# Patient Record
Sex: Male | Born: 1935 | Race: Black or African American | Hispanic: No | Marital: Married | Smoking: Never smoker
Health system: Southern US, Community
[De-identification: ages and names within clinical notes are randomized; demographics above are authoritative.]

## PROBLEM LIST (undated history)

## (undated) DIAGNOSIS — E119 Type 2 diabetes mellitus without complications: Secondary | ICD-10-CM

## (undated) DIAGNOSIS — I10 Essential (primary) hypertension: Secondary | ICD-10-CM

---

## 2017-10-11 ENCOUNTER — Other Ambulatory Visit: Payer: Self-pay | Admitting: Nephrology

## 2017-10-11 DIAGNOSIS — I129 Hypertensive chronic kidney disease with stage 1 through stage 4 chronic kidney disease, or unspecified chronic kidney disease: Secondary | ICD-10-CM

## 2017-10-11 DIAGNOSIS — N183 Chronic kidney disease, stage 3 unspecified: Secondary | ICD-10-CM

## 2017-10-20 ENCOUNTER — Ambulatory Visit
Admission: RE | Admit: 2017-10-20 | Discharge: 2017-10-20 | Disposition: A | Discharge status: Home / Self Care | Payer: Medicare Other | Source: Ambulatory Visit | Attending: Nephrology | Admitting: Nephrology

## 2017-10-20 DIAGNOSIS — N183 Chronic kidney disease, stage 3 unspecified: Secondary | ICD-10-CM

## 2017-10-20 DIAGNOSIS — I129 Hypertensive chronic kidney disease with stage 1 through stage 4 chronic kidney disease, or unspecified chronic kidney disease: Secondary | ICD-10-CM

## 2017-10-27 ENCOUNTER — Other Ambulatory Visit: Payer: Self-pay | Admitting: Nephrology

## 2017-10-27 DIAGNOSIS — N281 Cyst of kidney, acquired: Secondary | ICD-10-CM

## 2017-10-27 DIAGNOSIS — T1590XA Foreign body on external eye, part unspecified, unspecified eye, initial encounter: Secondary | ICD-10-CM

## 2017-11-09 ENCOUNTER — Ambulatory Visit
Admission: RE | Admit: 2017-11-09 | Discharge: 2017-11-09 | Disposition: A | Discharge status: Home / Self Care | Payer: Medicare Other | Source: Ambulatory Visit | Attending: Nephrology | Admitting: Nephrology

## 2017-11-09 DIAGNOSIS — T1590XA Foreign body on external eye, part unspecified, unspecified eye, initial encounter: Secondary | ICD-10-CM

## 2017-11-09 DIAGNOSIS — N281 Cyst of kidney, acquired: Secondary | ICD-10-CM

## 2019-04-04 IMAGING — US US RENAL
1 series · 14 of 25 positions shown · non-contrast
Comparison: None.

CLINICAL DATA: Chronic renal disease.

EXAM:
RENAL / URINARY TRACT ULTRASOUND COMPLETE

[Series 1: us renal · 0.28mm/px · 14 of 50 slices shown]
[im 1/50]
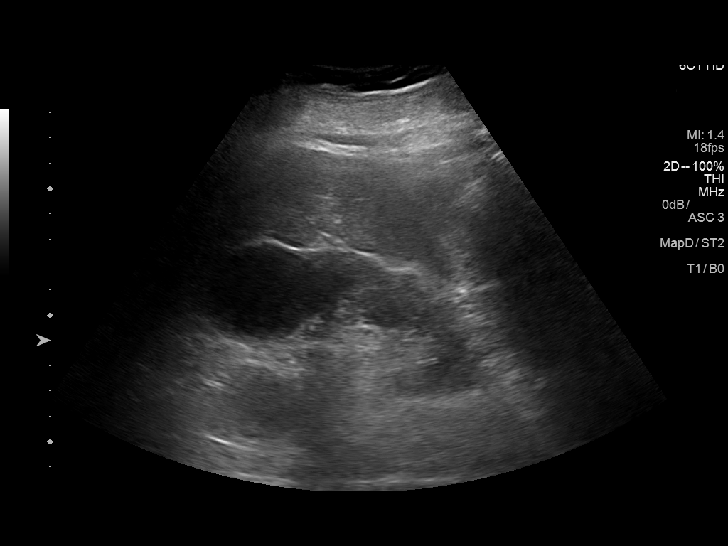
[im 5/50]
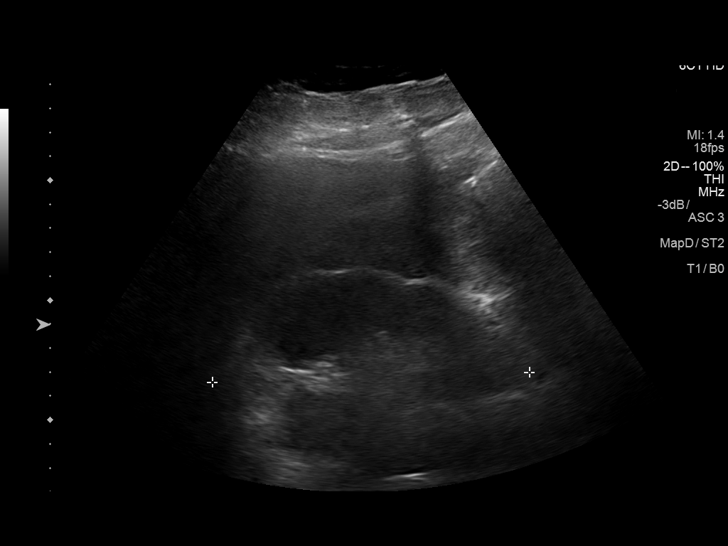
[im 9/50]
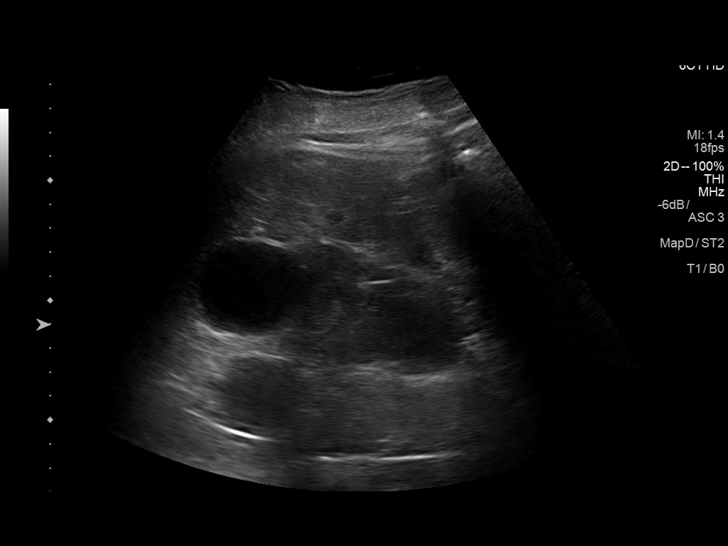
[im 13/50]
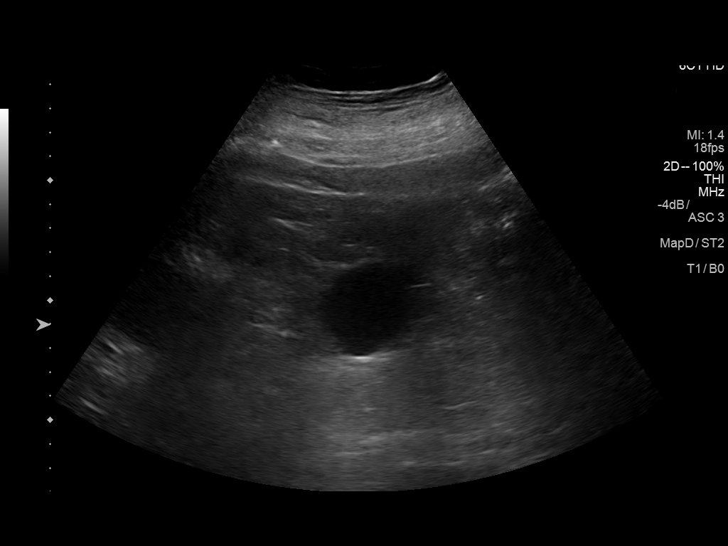
[im 17/50]
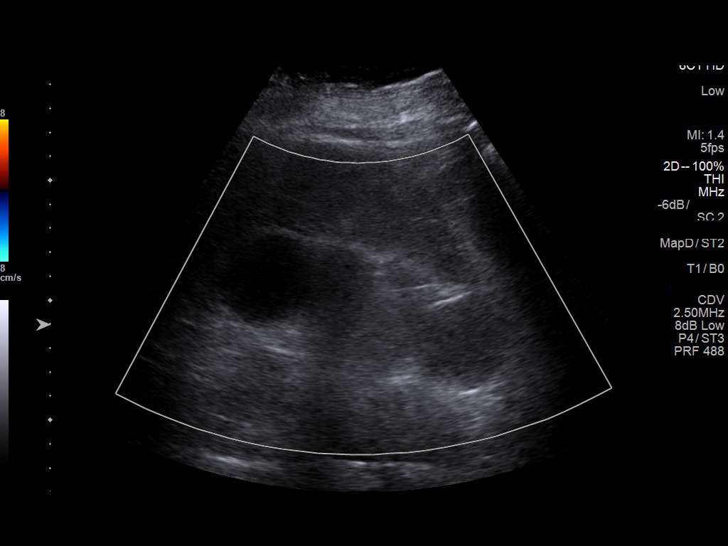
[im 19/50]
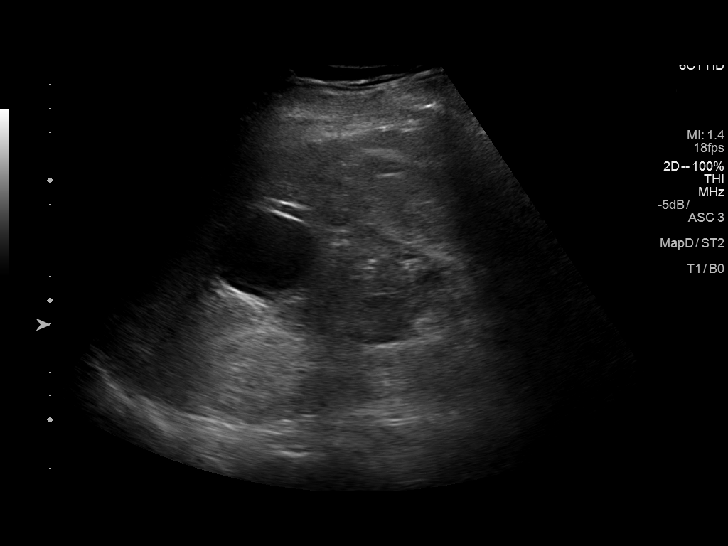
[im 23/50]
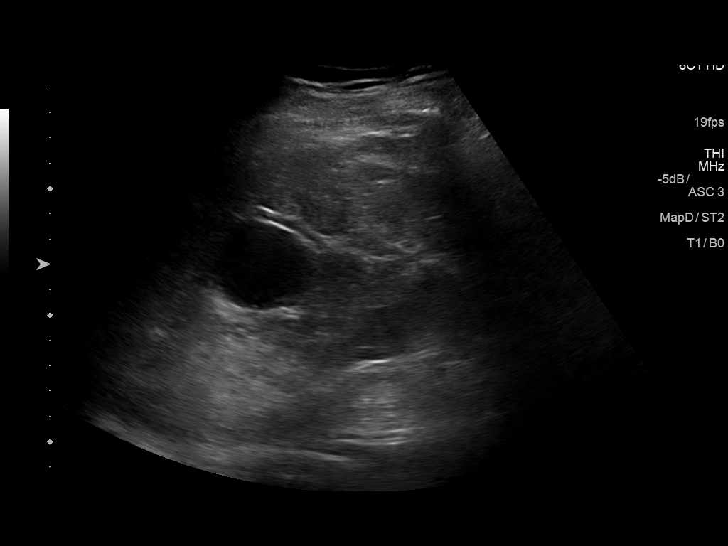
[im 27/50]
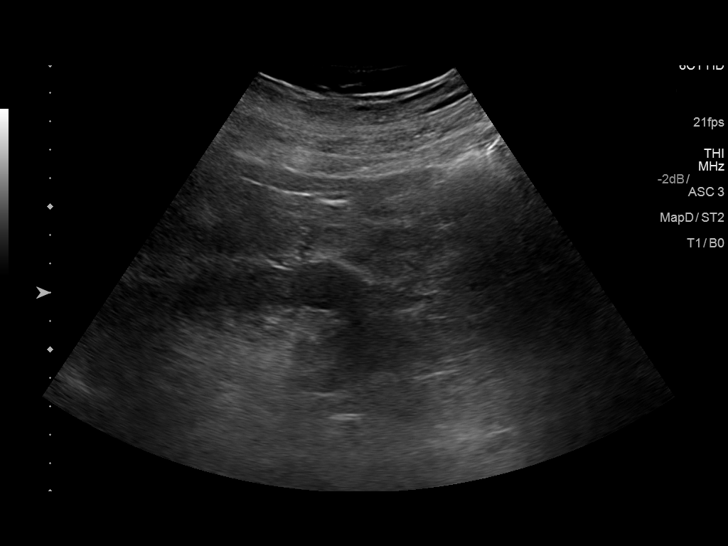
[im 31/50]
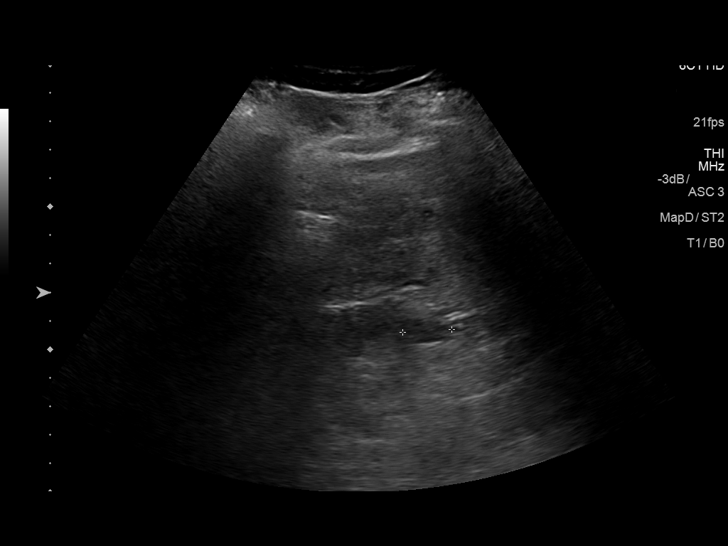
[im 33/50]
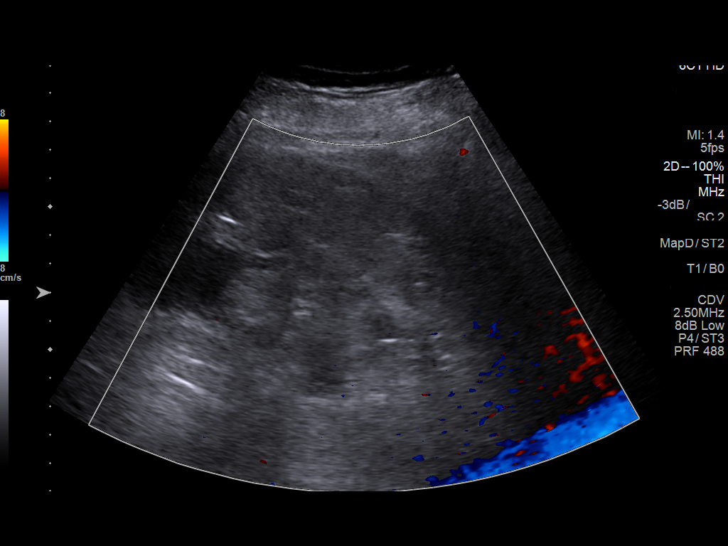
[im 37/50]
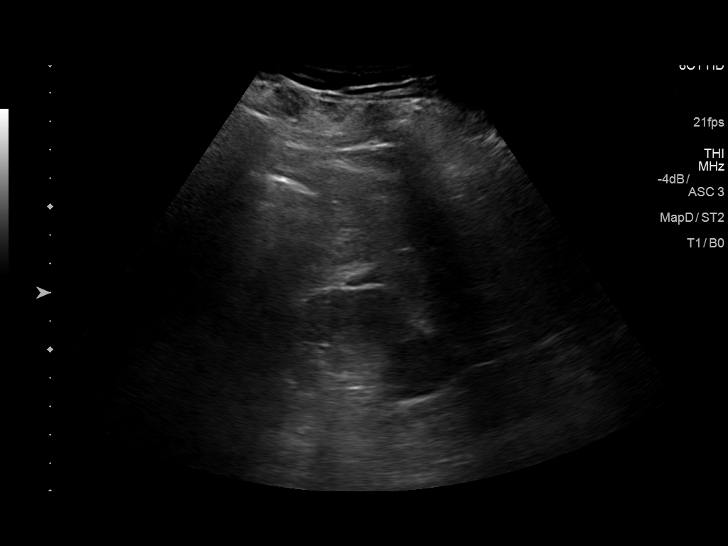
[im 41/50]
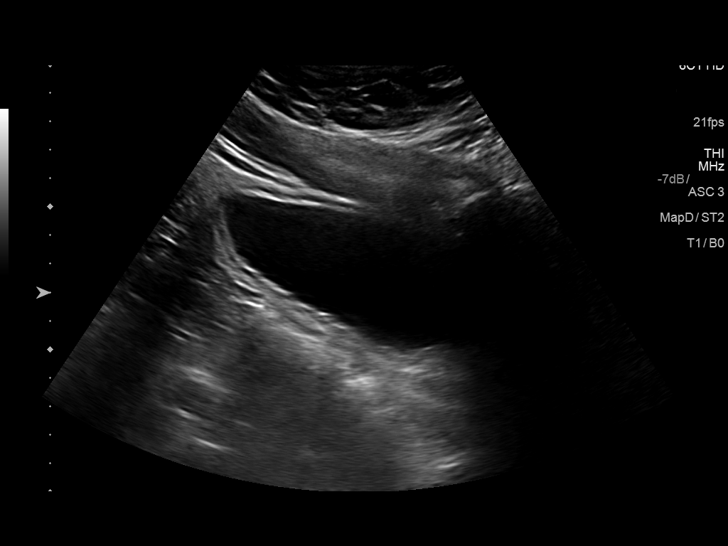
[im 45/50]
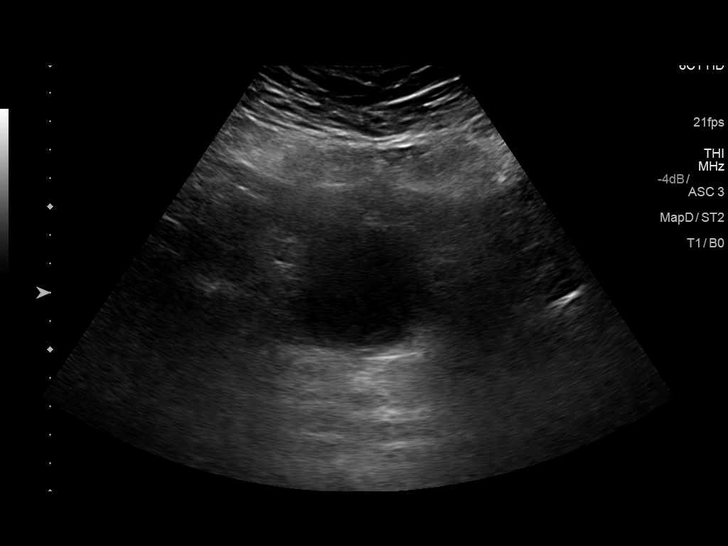
[im 50/50]
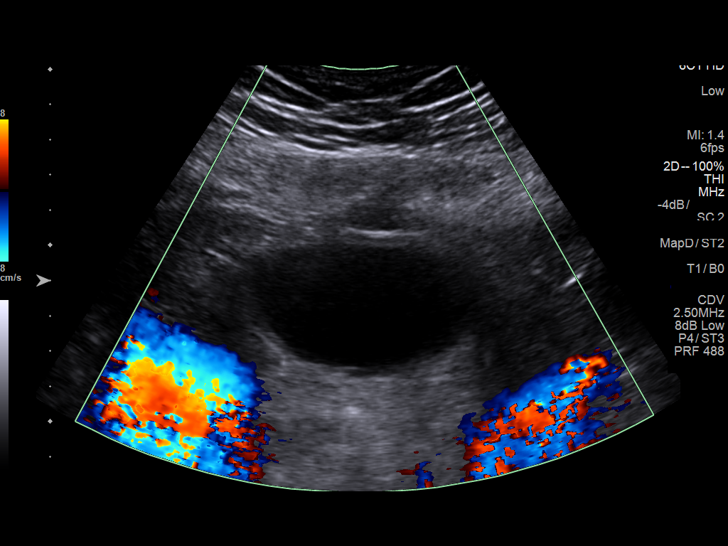

[14 of 25 positions shown; findings below may reference images not displayed]

FINDINGS: Right Kidney:

Length: 13.2 cm. Three masses are seen in the right kidney. The
first in the upper pole measures 5 cm and is a cyst. The second the
posterior aspect of the upper pole measures up to 3.8 cm and is
hypoechoic, probably a complex cyst. The third in the lower pole
measures 5 cm and is also likely a complex cyst. No hydronephrosis.

Left Kidney:

Length: 11.1 cm. Three masses are seen in the left kidney. The first
in the upper pole is a simple cyst measuring 4.1 cm. The second in
the mid to lower left kidney measures 1.5 cm and is hypoechoic with
no internal blood flow. The third in the posterior mid kidney
measures 2.7 cm and is also hypoechoic with no identified internal
blood flow. No hydronephrosis.

Bladder:

Appears normal for degree of bladder distention.
IMPRESSION: 1. Renal cysts.
2. There are several hypoechoic masses in both kidneys which are
nonspecific with ultrasound but most likely complex cysts. An MRI
could confirm as clinically warranted.

## 2021-07-19 ENCOUNTER — Emergency Department (HOSPITAL_COMMUNITY): Payer: No Typology Code available for payment source

## 2021-07-19 ENCOUNTER — Other Ambulatory Visit: Payer: Self-pay

## 2021-07-19 ENCOUNTER — Encounter (HOSPITAL_COMMUNITY): Payer: Self-pay

## 2021-07-19 ENCOUNTER — Inpatient Hospital Stay (HOSPITAL_COMMUNITY)
Admission: EM | Admit: 2021-07-19 | Discharge: 2021-07-23 | DRG: 641 | Disposition: A | Discharge status: Skilled Nursing Facility | Payer: No Typology Code available for payment source | Attending: Internal Medicine | Admitting: Internal Medicine

## 2021-07-19 ENCOUNTER — Observation Stay (HOSPITAL_COMMUNITY): Payer: No Typology Code available for payment source

## 2021-07-19 DIAGNOSIS — N179 Acute kidney failure, unspecified: Secondary | ICD-10-CM | POA: Diagnosis present

## 2021-07-19 DIAGNOSIS — Z794 Long term (current) use of insulin: Secondary | ICD-10-CM

## 2021-07-19 DIAGNOSIS — W19XXXA Unspecified fall, initial encounter: Secondary | ICD-10-CM | POA: Diagnosis present

## 2021-07-19 DIAGNOSIS — Z6830 Body mass index (BMI) 30.0-30.9, adult: Secondary | ICD-10-CM

## 2021-07-19 DIAGNOSIS — M25461 Effusion, right knee: Secondary | ICD-10-CM | POA: Diagnosis present

## 2021-07-19 DIAGNOSIS — Z23 Encounter for immunization: Secondary | ICD-10-CM

## 2021-07-19 DIAGNOSIS — E669 Obesity, unspecified: Secondary | ICD-10-CM | POA: Diagnosis present

## 2021-07-19 DIAGNOSIS — B029 Zoster without complications: Secondary | ICD-10-CM | POA: Diagnosis present

## 2021-07-19 DIAGNOSIS — M6282 Rhabdomyolysis: Secondary | ICD-10-CM

## 2021-07-19 DIAGNOSIS — D72829 Elevated white blood cell count, unspecified: Secondary | ICD-10-CM | POA: Diagnosis present

## 2021-07-19 DIAGNOSIS — I10 Essential (primary) hypertension: Secondary | ICD-10-CM | POA: Diagnosis present

## 2021-07-19 DIAGNOSIS — Z888 Allergy status to other drugs, medicaments and biological substances status: Secondary | ICD-10-CM

## 2021-07-19 DIAGNOSIS — I129 Hypertensive chronic kidney disease with stage 1 through stage 4 chronic kidney disease, or unspecified chronic kidney disease: Secondary | ICD-10-CM | POA: Diagnosis present

## 2021-07-19 DIAGNOSIS — Z79899 Other long term (current) drug therapy: Secondary | ICD-10-CM

## 2021-07-19 DIAGNOSIS — Z880 Allergy status to penicillin: Secondary | ICD-10-CM

## 2021-07-19 DIAGNOSIS — Z602 Problems related to living alone: Secondary | ICD-10-CM | POA: Diagnosis present

## 2021-07-19 DIAGNOSIS — E860 Dehydration: Secondary | ICD-10-CM | POA: Diagnosis not present

## 2021-07-19 DIAGNOSIS — E1122 Type 2 diabetes mellitus with diabetic chronic kidney disease: Secondary | ICD-10-CM | POA: Diagnosis present

## 2021-07-19 DIAGNOSIS — Y92009 Unspecified place in unspecified non-institutional (private) residence as the place of occurrence of the external cause: Secondary | ICD-10-CM

## 2021-07-19 DIAGNOSIS — Z7984 Long term (current) use of oral hypoglycemic drugs: Secondary | ICD-10-CM

## 2021-07-19 DIAGNOSIS — E119 Type 2 diabetes mellitus without complications: Secondary | ICD-10-CM

## 2021-07-19 DIAGNOSIS — R5381 Other malaise: Secondary | ICD-10-CM | POA: Diagnosis present

## 2021-07-19 DIAGNOSIS — E872 Acidosis, unspecified: Secondary | ICD-10-CM | POA: Diagnosis present

## 2021-07-19 DIAGNOSIS — H401112 Primary open-angle glaucoma, right eye, moderate stage: Secondary | ICD-10-CM | POA: Insufficient documentation

## 2021-07-19 DIAGNOSIS — Z887 Allergy status to serum and vaccine status: Secondary | ICD-10-CM

## 2021-07-19 DIAGNOSIS — N1832 Chronic kidney disease, stage 3b: Secondary | ICD-10-CM | POA: Diagnosis present

## 2021-07-19 DIAGNOSIS — Z7982 Long term (current) use of aspirin: Secondary | ICD-10-CM

## 2021-07-19 DIAGNOSIS — Z20822 Contact with and (suspected) exposure to covid-19: Secondary | ICD-10-CM | POA: Diagnosis present

## 2021-07-19 HISTORY — DX: Type 2 diabetes mellitus without complications: E11.9

## 2021-07-19 HISTORY — DX: Essential (primary) hypertension: I10

## 2021-07-19 LAB — CBC WITH DIFFERENTIAL/PLATELET
Abs Immature Granulocytes: 0.09 10*3/uL — ABNORMAL HIGH (ref 0.00–0.07)
Basophils Absolute: 0 10*3/uL (ref 0.0–0.1)
Basophils Relative: 0 %
Eosinophils Absolute: 0 10*3/uL (ref 0.0–0.5)
Eosinophils Relative: 0 %
HCT: 42.1 % (ref 39.0–52.0)
Hemoglobin: 14.2 g/dL (ref 13.0–17.0)
Immature Granulocytes: 1 %
Lymphocytes Relative: 5 %
Lymphs Abs: 0.8 10*3/uL (ref 0.7–4.0)
MCH: 29.5 pg (ref 26.0–34.0)
MCHC: 33.7 g/dL (ref 30.0–36.0)
MCV: 87.5 fL (ref 80.0–100.0)
Monocytes Absolute: 1.3 10*3/uL — ABNORMAL HIGH (ref 0.1–1.0)
Monocytes Relative: 7 %
Neutro Abs: 15.3 10*3/uL — ABNORMAL HIGH (ref 1.7–7.7)
Neutrophils Relative %: 87 %
Platelets: 193 10*3/uL (ref 150–400)
RBC: 4.81 MIL/uL (ref 4.22–5.81)
RDW: 13.7 % (ref 11.5–15.5)
WBC: 17.5 10*3/uL — ABNORMAL HIGH (ref 4.0–10.5)
nRBC: 0 % (ref 0.0–0.2)

## 2021-07-19 LAB — LACTIC ACID, PLASMA
Lactic Acid, Venous: 2.1 mmol/L (ref 0.5–1.9)
Lactic Acid, Venous: 1.5 mmol/L (ref 0.5–1.9)

## 2021-07-19 LAB — COMPREHENSIVE METABOLIC PANEL
ALT: 29 U/L (ref 0–44)
AST: 71 U/L — ABNORMAL HIGH (ref 15–41)
Albumin: 3.7 g/dL (ref 3.5–5.0)
Alkaline Phosphatase: 54 U/L (ref 38–126)
Anion gap: 12 (ref 5–15)
BUN: 47 mg/dL — ABNORMAL HIGH (ref 8–23)
CO2: 18 mmol/L — ABNORMAL LOW (ref 22–32)
Calcium: 9.2 mg/dL (ref 8.9–10.3)
Chloride: 112 mmol/L — ABNORMAL HIGH (ref 98–111)
Creatinine, Ser: 2.01 mg/dL — ABNORMAL HIGH (ref 0.61–1.24)
GFR, Estimated: 32 mL/min — ABNORMAL LOW (ref >60)
Glucose, Bld: 133 mg/dL — ABNORMAL HIGH (ref 70–99)
Potassium: 4.7 mmol/L (ref 3.5–5.1)
Sodium: 142 mmol/L (ref 135–145)
Total Bilirubin: 1.1 mg/dL (ref 0.3–1.2)
Total Protein: 7.6 g/dL (ref 6.5–8.1)

## 2021-07-19 LAB — URINALYSIS, ROUTINE W REFLEX MICROSCOPIC
Bacteria, UA: NONE SEEN
Bilirubin Urine: NEGATIVE
Glucose, UA: NEGATIVE mg/dL
Ketones, ur: 20 mg/dL — AB
Leukocytes,Ua: NEGATIVE
Nitrite: NEGATIVE
Protein, ur: 100 mg/dL — AB
Specific Gravity, Urine: 1.018 (ref 1.005–1.030)
pH: 5 (ref 5.0–8.0)

## 2021-07-19 LAB — GLUCOSE, CAPILLARY
Glucose-Capillary: 143 mg/dL — ABNORMAL HIGH (ref 70–99)
Glucose-Capillary: 163 mg/dL — ABNORMAL HIGH (ref 70–99)

## 2021-07-19 LAB — BASIC METABOLIC PANEL
Anion gap: 13 (ref 5–15)
BUN: 45 mg/dL — ABNORMAL HIGH (ref 8–23)
CO2: 18 mmol/L — ABNORMAL LOW (ref 22–32)
Calcium: 8.8 mg/dL — ABNORMAL LOW (ref 8.9–10.3)
Chloride: 112 mmol/L — ABNORMAL HIGH (ref 98–111)
Creatinine, Ser: 1.87 mg/dL — ABNORMAL HIGH (ref 0.61–1.24)
GFR, Estimated: 34 mL/min — ABNORMAL LOW (ref >60)
Glucose, Bld: 170 mg/dL — ABNORMAL HIGH (ref 70–99)
Potassium: 4 mmol/L (ref 3.5–5.1)
Sodium: 143 mmol/L (ref 135–145)

## 2021-07-19 LAB — HEMOGLOBIN A1C
Hgb A1c MFr Bld: 7.1 % — ABNORMAL HIGH (ref 4.8–5.6)
Mean Plasma Glucose: 157.07 mg/dL

## 2021-07-19 LAB — ETHANOL: Alcohol, Ethyl (B): <10 mg/dL (ref <10)

## 2021-07-19 LAB — CK: Total CK: 2076 U/L — ABNORMAL HIGH (ref 49–397)

## 2021-07-19 LAB — CBG MONITORING, ED: Glucose-Capillary: 116 mg/dL — ABNORMAL HIGH (ref 70–99)

## 2021-07-19 LAB — SARS CORONAVIRUS 2 BY RT PCR: SARS Coronavirus 2 by RT PCR: NEGATIVE

## 2021-07-19 MED ORDER — ATENOLOL 50 MG PO TABS
100.0000 mg | ORAL_TABLET | Freq: Every day | ORAL | Status: DC
  Administered 2021-07-19 – 2021-07-23 (×5): 100 mg via ORAL
  Filled 2021-07-19 (×5): qty 2

## 2021-07-19 MED ORDER — ENOXAPARIN SODIUM 40 MG/0.4ML IJ SOSY
40.0000 mg | PREFILLED_SYRINGE | INTRAMUSCULAR | Status: DC
  Administered 2021-07-19 – 2021-07-22 (×4): 40 mg via SUBCUTANEOUS
  Filled 2021-07-19 (×4): qty 0.4

## 2021-07-19 MED ORDER — INSULIN ASPART 100 UNIT/ML IJ SOLN
0.0000 [IU] | Freq: Three times a day (TID) | INTRAMUSCULAR | Status: DC
  Administered 2021-07-20: 5 [IU] via SUBCUTANEOUS
  Administered 2021-07-20: 3 [IU] via SUBCUTANEOUS
  Administered 2021-07-20: 5 [IU] via SUBCUTANEOUS
  Administered 2021-07-21: 2 [IU] via SUBCUTANEOUS
  Administered 2021-07-21: 5 [IU] via SUBCUTANEOUS
  Administered 2021-07-21: 3 [IU] via SUBCUTANEOUS
  Administered 2021-07-22: 5 [IU] via SUBCUTANEOUS
  Administered 2021-07-22 – 2021-07-23 (×2): 3 [IU] via SUBCUTANEOUS

## 2021-07-19 MED ORDER — LACTATED RINGERS IV BOLUS
1000.0000 mL | Freq: Once | INTRAVENOUS | Status: AC
  Administered 2021-07-19: 1000 mL via INTRAVENOUS

## 2021-07-19 MED ORDER — HYDRALAZINE HCL 20 MG/ML IJ SOLN
10.0000 mg | INTRAMUSCULAR | Status: DC | PRN
  Administered 2021-07-19 – 2021-07-23 (×4): 10 mg via INTRAVENOUS
  Filled 2021-07-19 (×4): qty 1

## 2021-07-19 MED ORDER — INSULIN GLARGINE-YFGN 100 UNIT/ML ~~LOC~~ SOLN
45.0000 [IU] | Freq: Every day | SUBCUTANEOUS | Status: DC
Start: 2021-07-19 — End: 2021-07-23
  Administered 2021-07-19 – 2021-07-22 (×4): 45 [IU] via SUBCUTANEOUS
  Filled 2021-07-19 (×4): qty 0.45

## 2021-07-19 MED ORDER — FAMOTIDINE 20 MG PO TABS
20.0000 mg | ORAL_TABLET | Freq: Every day | ORAL | Status: DC
  Administered 2021-07-19 – 2021-07-22 (×4): 20 mg via ORAL
  Filled 2021-07-19 (×4): qty 1

## 2021-07-19 MED ORDER — LACTATED RINGERS IV SOLN: INTRAVENOUS | Status: DC

## 2021-07-19 MED ORDER — LATANOPROST 0.005 % OP SOLN
1.0000 [drp] | Freq: Every day | OPHTHALMIC | Status: DC
  Administered 2021-07-19 – 2021-07-22 (×4): 1 [drp] via OPHTHALMIC
  Filled 2021-07-19: qty 2.5

## 2021-07-19 MED ORDER — ENSURE ENLIVE PO LIQD
237.0000 mL | Freq: Two times a day (BID) | ORAL | Status: DC
Start: 2021-07-20 — End: 2021-07-23
  Administered 2021-07-20 – 2021-07-23 (×6): 237 mL via ORAL

## 2021-07-19 MED ORDER — TIMOLOL MALEATE 0.5 % OP SOLG
1.0000 [drp] | Freq: Every morning | OPHTHALMIC | Status: DC
  Administered 2021-07-20 – 2021-07-23 (×4): 1 [drp] via OPHTHALMIC
  Filled 2021-07-19: qty 5

## 2021-07-19 NOTE — ED Provider Notes (Signed)
[facility]EM[facility]GENCY DEPT Provider Note   CSN: [id number] Arrival date & time: 07/19/21  1157     History  Chief Complaint  Patient presents with   Fall   Weakness   Altered Mental Status    Tavaris Eudy is a 88 y.o. male.  88 year old male who presents from home due to weakness and falls.  Patient lives by himself and states that his legs gave out on him and he fell down.  Did not strike his head.  Denies any recent illnesses.  No new medication changes.  Has had some dysuria as well as dark urine.  No chest pain or shortness of breath.  EMS was called after his family had trouble getting a hold of him.  Blood sugar was 140.  Patient transported here for further evaluation       Home Medications Prior to Admission medications   Not on File      Allergies    Metformin and related and Penicillins    Review of Systems   Review of Systems  All other systems reviewed and are negative.   Physical Exam Updated Vital Signs BP (!) 172/77 (BP Location: Right Arm)   Pulse 97   Temp 98.6 F (37 C) (Oral)   Resp (!) 29   Ht 1.803 m (5\' 11" )   Wt 99.8 kg   SpO2 98%   BMI 30.68 kg/m  Physical Exam Vitals and nursing note reviewed.  Constitutional:      General: He is not in acute distress.    Appearance: Normal appearance. He is well-developed. He is not toxic-appearing.  HENT:     Head: Normocephalic and atraumatic.  Eyes:     General: Lids are normal.     Conjunctiva/sclera: Conjunctivae normal.     Pupils: Pupils are equal, round, and reactive to light.  Neck:     Thyroid: No thyroid mass.     Trachea: No tracheal deviation.  Cardiovascular:     Rate and Rhythm: Normal rate and regular rhythm.     Heart sounds: Normal heart sounds. No murmur heard.    No gallop.  Pulmonary:     Effort: Pulmonary effort is normal. No respiratory distress.     Breath sounds: Normal breath sounds. No stridor. No decreased breath sounds, wheezing, rhonchi or  rales.  Abdominal:     General: There is no distension.     Palpations: Abdomen is soft.     Tenderness: There is no abdominal tenderness. There is no rebound.  Musculoskeletal:        General: No tenderness. Normal range of motion.     Cervical back: Normal range of motion and neck supple.  Skin:    General: Skin is warm and dry.     Findings: No abrasion or rash.  Neurological:     General: No focal deficit present.     Mental Status: He is alert and oriented to person, place, and time. Mental status is at baseline.     GCS: GCS eye subscore is 4. GCS verbal subscore is 5. GCS motor subscore is 6.     Cranial Nerves: No cranial nerve deficit.     Sensory: No sensory deficit.     Motor: Motor function is intact.  Psychiatric:        Attention and Perception: Attention normal.        Speech: Speech normal.        Behavior: Behavior normal.    ED  Results / Procedures / Treatments   Labs (all labs ordered are listed, but only abnormal results are displayed) Labs Reviewed  CBG MONITORING, ED - Abnormal; Notable for the following components:      Result Value   Glucose-Capillary 116 (*)    All other components within normal limits  SARS CORONAVIRUS 2 BY RT PCR  CULTURE, BLOOD (ROUTINE X 2)  CULTURE, BLOOD (ROUTINE X 2)  URINALYSIS, ROUTINE W REFLEX MICROSCOPIC  CBC WITH DIFFERENTIAL/PLATELET  COMPREHENSIVE METABOLIC PANEL  ETHANOL  LACTIC ACID, PLASMA  LACTIC ACID, PLASMA    EKG EKG Interpretation  Date/Time:  Saturday July 19 2021 12:51:45 EDT Ventricular Rate:  96 PR Interval:  186 QRS Duration: 82 QT Interval:  348 QTC Calculation: 440 R Axis:   84 Text Interpretation: Sinus rhythm Borderline right axis deviation Confirmed by Lorre Nick ([id number]) on 07/19/2021 1:49:41 PM  Radiology No results found.  Procedures Procedures    Medications Ordered in ED Medications  lactated ringers bolus 1,000 mL (has no administration in time range)  lactated ringers  infusion (has no administration in time range)    ED Course/ Medical Decision Making/ A&P                           Medical Decision Making Amount and/or Complexity of Data Reviewed Labs: ordered. Radiology: ordered. ECG/medicine tests: ordered.  Risk Prescription drug management. Decision regarding hospitalization.  Patient is EKG per my interpretation shows normal sinus rhythm.  No signs of acute ischemic changes. Patient has evidence of dehydration based on his electrolytes.  Was given IV fluids here.  My leukocytosis on the CBC.  Chest x-ray per my interpretation showed no acute findings.  Patient has a mild elevation of his CK at over 2000.  Possibly some element of rhabdomyolysis.  This will be treated with IV fluids.  He is COVID-negative here.  Does have a mildly elevated lactate 2.1.  Will repeat.  He is afebrile.  We will hold off on doing full sepsis protocol at this time.  Discussed with hospitalist and patient will be admitted  CRITICAL CARE Performed by: Toy Baker Total critical care time: 50 minutes Critical care time was exclusive of separately billable procedures and treating other patients. Critical care was necessary to treat or prevent imminent or life-threatening deterioration. Critical care was time spent personally by me on the following activities: development of treatment plan with patient and/or surrogate as well as nursing, discussions with consultants, evaluation of patient's response to treatment, examination of patient, obtaining history from patient or surrogate, ordering and performing treatments and interventions, ordering and review of laboratory studies, ordering and review of radiographic studies, pulse oximetry and re-evaluation of patient's condition.         Final Clinical Impression(s) / ED Diagnoses Final diagnoses:  None    Rx / DC Orders ED Discharge Orders     None         Lorre Nick, MD 07/19/21 1454

## 2021-07-19 NOTE — ED Notes (Signed)
ED TO INPATIENT HANDOFF REPORT  Name/Age/Gender Antonio White 88 y.o. male  Code Status   Home/SNF/Other Home  Chief Complaint AKI (acute kidney injury) (HCC) [N17.9]  Level of Care/Admitting Diagnosis ED Disposition     ED Disposition  Admit   Condition  --   Comment  Hospital Area: [facility] [[id number]]  Level of Care: Telemetry [5]  Admit to tele based on following criteria: Other see comments  Comments: fall  [facility] or [facility] if equivalent level of care is available:: Yes  Covid Evaluation: Asymptomatic - no recent exposure (last 10 days) testing not required  Diagnosis: AKI (acute kidney injury) [facility]) [[id number]]  Admitting Physician: Erenest Blank  Attending Physician: Kathrynn Running [[id number]]          Medical History Past Medical History:  Diagnosis Date   Diabetes mellitus without complication (HCC)    Hypertension     Allergies Allergies  Allergen Reactions   Metformin And Related Hives   Penicillins Hives   Pneumococcal Vaccine Swelling and Other (See Comments)    Swelling of upper limb - reported by [facility]    IV Location/Drains/Wounds Patient Lines/Drains/Airways Status     Active Line/Drains/Airways     Name Placement date Placement time Site Days   Peripheral IV 07/19/21 20 G 1" Right;Posterior Hand 07/19/21  --  Hand  less than 1   Peripheral IV 07/19/21 20 G 1" Left Antecubital 07/19/21  1312  Antecubital  less than 1            Labs/Imaging Results for orders placed or performed during the hospital encounter of 07/19/21 (from the past 48 hour(s))  CBG monitoring, ED     Status: Abnormal   Collection Time: 07/19/21 12:24 PM  Result Value Ref Range   Glucose-Capillary 116 (H) 70 - 99 mg/dL    Comment: Glucose reference range applies only to samples taken after fasting for at least 8 hours.  SARS Coronavirus 2 by RT PCR (hospital order, performed in [facility] hospital lab) *cepheid single result test* Anterior Nasal Swab     Status: None   Collection Time: 07/19/21 12:34 PM   Specimen: Anterior Nasal Swab  Result Value Ref Range   SARS Coronavirus 2 by RT PCR NEGATIVE NEGATIVE    Comment: (NOTE) SARS-CoV-2 target nucleic acids are NOT DETECTED.  The SARS-CoV-2 RNA is generally detectable in upper and lower respiratory specimens during the acute phase of infection. The lowest concentration of SARS-CoV-2 viral copies this assay can detect is 250 copies / mL. A negative result does not preclude SARS-CoV-2 infection and should not be used as the sole basis for treatment or other patient management decisions.  A negative result may occur with improper specimen collection / handling, submission of specimen other than nasopharyngeal swab, presence of viral mutation(s) within the areas targeted by this assay, and inadequate number of viral copies (<250 copies / mL). A negative result must be combined with clinical observations, patient history, and epidemiological information.  Fact Sheet for Patients:   RoadLapTop.co.za  Fact Sheet for Healthcare Providers: http://kim-miller.com/  This test is not yet approved or  cleared by the [place] FDA and has been authorized for detection and/or diagnosis of SARS-CoV-2 by FDA under an Emergency Use Authorization (EUA).  This EUA will remain in effect (meaning this test can be used) for the duration of the COVID-19 declaration under Section 564(b)(1) of the Act,  21 U.S.C. section 360bbb-3(b)(1), unless the authorization is terminated or revoked sooner.  Performed at [facility], [address], [place], [place] [address]   Ethanol     Status: None   Collection Time: 07/19/21  1:17 PM  Result Value Ref Range   Alcohol, Ethyl (B) <10 <10 mg/dL    Comment: (NOTE) Lowest detectable limit for serum alcohol is 10 mg/dL.  For  medical purposes only. Performed at [facility], [address] [place], [place] [address]   Lactic acid, plasma     Status: Abnormal   Collection Time: 07/19/21  1:17 PM  Result Value Ref Range   Lactic Acid, Venous 2.1 (HH) 0.5 - 1.9 mmol/L    Comment: CRITICAL RESULT CALLED TO, READ BACK BY AND VERIFIED WITH SMITH,A AT 1427 ON 07/19/21 BY LUZOLOP Performed at [facility], [address] [place], [place] [address]   CBC with Differential/Platelet     Status: Abnormal   Collection Time: 07/19/21  1:44 PM  Result Value Ref Range   WBC 17.5 (H) 4.0 - 10.5 K/uL   RBC 4.81 4.22 - 5.81 MIL/uL   Hemoglobin 14.2 13.0 - 17.0 g/dL   HCT 25.4 27.0 - 62.3 %   MCV 87.5 80.0 - 100.0 fL   MCH 29.5 26.0 - 34.0 pg   MCHC 33.7 30.0 - 36.0 g/dL   RDW 76.2 83.1 - 51.7 %   Platelets 193 150 - 400 K/uL   nRBC 0.0 0.0 - 0.2 %   Neutrophils Relative % 87 %   Neutro Abs 15.3 (H) 1.7 - 7.7 K/uL   Lymphocytes Relative 5 %   Lymphs Abs 0.8 0.7 - 4.0 K/uL   Monocytes Relative 7 %   Monocytes Absolute 1.3 (H) 0.1 - 1.0 K/uL   Eosinophils Relative 0 %   Eosinophils Absolute 0.0 0.0 - 0.5 K/uL   Basophils Relative 0 %   Basophils Absolute 0.0 0.0 - 0.1 K/uL   Immature Granulocytes 1 %   Abs Immature Granulocytes 0.09 (H) 0.00 - 0.07 K/uL    Comment: Performed at [facility], [address] [place], [place] [address]  Comprehensive metabolic panel     Status: Abnormal   Collection Time: 07/19/21  1:44 PM  Result Value Ref Range   Sodium 142 135 - 145 mmol/L   Potassium 4.7 3.5 - 5.1 mmol/L   Chloride 112 (H) 98 - 111 mmol/L   CO2 18 (L) 22 - 32 mmol/L   Glucose, Bld 133 (H) 70 - 99 mg/dL    Comment: Glucose reference range applies only to samples taken after fasting for at least 8 hours.   BUN 47 (H) 8 - 23 mg/dL   Creatinine, Ser 3.71 (H) 0.61 - 1.24 mg/dL   Calcium 9.2 8.9 - 06.2 mg/dL   Total Protein 7.6 6.5 - 8.1 g/dL   Albumin 3.7  3.5 - 5.0 g/dL   AST 71 (H) 15 - 41 U/L   ALT 29 0 - 44 U/L   Alkaline Phosphatase 54 38 - 126 U/L   Total Bilirubin 1.1 0.3 - 1.2 mg/dL   GFR, Estimated 32 (L) >60 mL/min    Comment: (NOTE) Calculated using the CKD-EPI Creatinine Equation (2021)    Anion gap 12 5 - 15    Comment: Performed at [facility], [address] [place], [place] [address]  CK     Status: Abnormal   Collection Time: 07/19/21  1:44 PM  Result Value Ref Range   Total CK 2,076 (H) 49 - 397 U/L    Comment: Performed at [facility], [address], [place], [place] [address]  Urinalysis, Routine w reflex microscopic Urine, In & Out Cath     Status: Abnormal   Collection Time: 07/19/21  2:53 PM  Result Value Ref Range   Color, Urine YELLOW YELLOW   APPearance CLEAR CLEAR   Specific Gravity, Urine 1.018 1.005 - 1.030   pH 5.0 5.0 - 8.0   Glucose, UA NEGATIVE NEGATIVE mg/dL   Hgb urine dipstick LARGE (A) NEGATIVE   Bilirubin Urine NEGATIVE NEGATIVE   Ketones, ur 20 (A) NEGATIVE mg/dL   Protein, ur 762 (A) NEGATIVE mg/dL   Nitrite NEGATIVE NEGATIVE   Leukocytes,Ua NEGATIVE NEGATIVE   RBC / HPF 0-5 0 - 5 RBC/hpf   WBC, UA 0-5 0 - 5 WBC/hpf   Bacteria, UA NONE SEEN NONE SEEN   Mucus PRESENT    Hyaline Casts, UA PRESENT     Comment: Performed at [facility], [address], [place], [place] [address]   DG Chest Port 1 View  Result Date: 07/19/2021 CLINICAL DATA:  Shortness of breath EXAM: PORTABLE CHEST 1 VIEW COMPARISON:  None Available. FINDINGS: The heart size and mediastinal contours are within normal limits. Both lungs are clear. The visualized skeletal structures are unremarkable. IMPRESSION: No active disease. Electronically Signed   By: Duanne Guess D.O.   On: 07/19/2021 14:00    Pending Labs Unresulted Labs (From admission, onward)     Start     Ordered   07/19/21 1235  Culture, blood (Routine X 2) w Reflex to ID Panel  BLOOD CULTURE X  2,   R     Question:  Patient immune status  Answer:  Normal   07/19/21 1234   07/19/21 1235  Lactic acid, plasma  Now then every 2 hours,   R      07/19/21 1234   Signed and Held  Hemoglobin A1c  Add-on,   R       Comments: To assess prior glycemic control    Signed and Held   Signed and Held  CBC  Tomorrow morning,   R        Signed and Held   Signed and Held  Basic metabolic panel  Tomorrow morning,   R        Signed and Held   Signed and Held  CK  Tomorrow morning,   R        Signed and Held   Signed and Held  Basic metabolic panel  Once-Timed,   R        Signed and Held            Vitals/Pain Today's Vitals   07/19/21 1217 07/19/21 1218 07/19/21 1413 07/19/21 1430  BP:  (!) 172/77 (!) 189/86 (!) 183/80  Pulse:  97 93 97  Resp:  (!) 29 (!) 24 (!) 28  Temp:  98.6 F (37 C)    TempSrc:  Oral    SpO2:  98% 100% 98%  Weight: 220 lb (99.8 kg)     Height: 5\' 11"  (1.803 m)     PainSc: 0-No pain       Isolation Precautions Airborne and Contact precautions  Medications Medications  lactated ringers infusion ( Intravenous New Bag/Given 07/19/21 1451)  lactated ringers bolus 1,000 mL (0 mLs Intravenous Stopped 07/19/21 1451)  Mobility walks with person assist

## 2021-07-19 NOTE — ED Triage Notes (Signed)
New onset confusion to time and situation

## 2021-07-19 NOTE — Progress Notes (Signed)
Given report from RN from prior shift. Agree with previous RN assessment. Pt resting comfortably in bed. Denies any further needs at this time.

## 2021-07-19 NOTE — Progress Notes (Signed)
Patient awake, alert but confused. Patient cleaned, assessed and medicated for this evening as per orders. Patient made aware of pending transfer to 4W. Report called to [place], Charity fundraiser. Patient to be transferred in bed to new unit by staff.

## 2021-07-19 NOTE — H&P (Signed)
History and Physical    Antonio White IRC:[id number] DOB: 06-Aug-1932 DOA: 07/19/2021  PCP: Anson Fret, MD  Patient coming from: home by way of ems   Chief Complaint: fall  HPI: Antonio White is a 88 y.o. male with medical history significant for obesity, dm, htn, ckd 3b, cared for by the [facility], presents with the above.  Has been dealing with shingles affecting right upper leg for 3 months. Says his right knee also "gives out" on him sometimes. Earlier today his leg gave out and he crumbled to the floor against the wall. He denies trauma. No pain. He was unable to get up. A family member came to check on him, heard him inside, and called EMS. He thinks he was down for several hours. He denies chest pain or cough, no nausea/vomiting/diarrhea, no dysuria or frequency. Lives alone.   ED Course:   Labs show mild rhabdo and aki. Fluids ordered.   Review of Systems: As per HPI otherwise 10 point review of systems negative.    Past Medical History:  Diagnosis Date   Diabetes mellitus without complication (HCC)    Hypertension     History reviewed. No pertinent surgical history.   has no history on file for tobacco use, alcohol use, and drug use.  Allergies  Allergen Reactions   Metformin And Related Hives   Penicillins Hives   Pneumococcal Vaccine Swelling and Other (See Comments)    Swelling of upper limb - reported by [facility]    No family history on file.  Prior to Admission medications   Medication Sig Start Date End Date Taking? Authorizing Provider  Alogliptin Benzoate 25 MG TABS Take 25 mg by mouth every morning.   Yes [provider]  aspirin EC 81 MG tablet Take 81 mg by mouth daily. Swallow whole.   Yes [provider]  atenolol (TENORMIN) 100 MG tablet Take 100 mg by mouth daily.   Yes [provider]  famotidine (PEPCID) 20 MG tablet Take 20 mg by mouth at bedtime.   Yes [provider]  glipiZIDE (GLUCOTROL) 10 MG tablet Take 10  mg by mouth 2 (two) times daily before a meal.   Yes [provider]  insulin glargine-yfgn (SEMGLEE) 100 UNIT/ML injection Inject 57-63 Units into the skin See admin instructions. Inject 60 unit subcutaneously daily at bedtime. Hold insulin if blood sugar is less than 110. Resume insulin when greater than 110. If blood sugar is greater than 140 for 3 days, increase insulin by 3 units. If blood sugar is less than 100 for 3 days, then decrease insulin by 3 units.   Yes [provider]  latanoprost (XALATAN) 0.005 % ophthalmic solution Place 1 drop into both eyes at bedtime.   Yes [provider]  lisinopril (ZESTRIL) 20 MG tablet Take 20 mg by mouth daily.   Yes [provider]  Multiple Vitamins-Minerals (CENTRUM SILVER 50+MEN) TABS Take 1 tablet by mouth daily with breakfast.   Yes [provider]  timolol (TIMOPTIC-XR) 0.5 % ophthalmic gel-forming Place 1 drop into the right eye every morning.   Yes [provider]    Physical Exam: Vitals:   07/19/21 1217 07/19/21 1218 07/19/21 1413 07/19/21 1430  BP:  (!) 172/77 (!) 189/86 (!) 183/80  Pulse:  97 93 97  Resp:  (!) 29 (!) 24 (!) 28  Temp:  98.6 F (37 C)    TempSrc:  Oral    SpO2:  98% 100% 98%  Weight: 99.8  kg     Height: 5\' 11"  (1.803 m)       Constitutional: No acute distress Head: Atraumatic Eyes: Conjunctiva clear ENM: Moist mucous membranes. Normal dentition.  Neck: Supple Respiratory: Clear to auscultation bilaterally, no wheezing/rales/rhonchi. Normal respiratory effort. No accessory muscle use. . Cardiovascular: Regular rate and rhythm. Moderate systolic murmur Abdomen: Non-tender, non-distended. No masses. No rebound or guarding. Positive bowel sounds. Musculoskeletal: No joint deformity upper and lower extremities. Normal ROM, no contractures. Normal muscle tone.  Skin: healing ulcers right lateral thigh Extremities: No peripheral edema. Palpable peripheral  pulses. Neurologic: Alert, moving all 4 extremities. Psychiatric: somewhat confused   Labs on Admission: I have personally reviewed following labs and imaging studies  CBC: Recent Labs  Lab 07/19/21 1344  WBC 17.5*  NEUTROABS 15.3*  HGB 14.2  HCT 42.1  MCV 87.5  PLT 193   Basic Metabolic Panel: Recent Labs  Lab 07/19/21 1344  NA 142  K 4.7  CL 112*  CO2 18*  GLUCOSE 133*  BUN 47*  CREATININE 2.01*  CALCIUM 9.2   GFR: Estimated Creatinine Clearance: 31.2 mL/min (A) (by C-G formula based on SCr of 2.01 mg/dL (H)). Liver Function Tests: Recent Labs  Lab 07/19/21 1344  AST 71*  ALT 29  ALKPHOS 54  BILITOT 1.1  PROT 7.6  ALBUMIN 3.7   No results for input(s): "LIPASE", "AMYLASE" in the last 168 hours. No results for input(s): "AMMONIA" in the last 168 hours. Coagulation Profile: No results for input(s): "INR", "PROTIME" in the last 168 hours. Cardiac Enzymes: Recent Labs  Lab 07/19/21 1344  CKTOTAL 2,076*   BNP (last 3 results) No results for input(s): "PROBNP" in the last 8760 hours. HbA1C: No results for input(s): "HGBA1C" in the last 72 hours. CBG: Recent Labs  Lab 07/19/21 1224  GLUCAP 116*   Lipid Profile: No results for input(s): "CHOL", "HDL", "LDLCALC", "TRIG", "CHOLHDL", "LDLDIRECT" in the last 72 hours. Thyroid Function Tests: No results for input(s): "TSH", "T4TOTAL", "FREET4", "T3FREE", "THYROIDAB" in the last 72 hours. Anemia Panel: No results for input(s): "VITAMINB12", "FOLATE", "FERRITIN", "TIBC", "IRON", "RETICCTPCT" in the last 72 hours. Urine analysis: No results found for: "COLORURINE", "APPEARANCEUR", "LABSPEC", "PHURINE", "GLUCOSEU", "HGBUR", "BILIRUBINUR", "KETONESUR", "PROTEINUR", "UROBILINOGEN", "NITRITE", "LEUKOCYTESUR"  Radiological Exams on Admission: DG Chest Port 1 View  Result Date: 07/19/2021 CLINICAL DATA:  Shortness of breath EXAM: PORTABLE CHEST 1 VIEW COMPARISON:  None Available. FINDINGS: The heart size and  mediastinal contours are within normal limits. Both lungs are clear. The visualized skeletal structures are unremarkable. IMPRESSION: No active disease. Electronically Signed   By: 07/21/2021 D.O.   On: 07/19/2021 14:00    EKG: Independently reviewed. 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 Probably not safe to continue living home alone without more support. - f/u CT head - PT/OT consults  # Acute kidney injury on ckd 3b # [facility]dom[facility]olysis [facility] 1.7. here 2.01. with ck of 2076 and LA 2.1. Likely 2/2 decreased PO from extended time on floor. - IVF - bmp tonight and again tomorrow, repeat CK tomorrow  # Leukocytosis Likely reactive 2/2 dehydration. No infectious symptoms.  Cxr clear. - f/u ua - f/u blood cultures - f/u covid - monitor  # T2DM Here normoglycemic - semglee 45 for home lantus of ~60 - SSI - hold home alogliptin, glipizide  # HTN Here hypertensive to 180s, asymptomatic - resume home atenolol; give hydral prn - hold home aspirin, I don't see an indication   DVT prophylaxis: lovenox Code Status: full  Family Communication: brother updated telephonically Consults called: none   Level of care: Telemetry Status is: Observation The patient remains OBS appropriate and will d/c before 2 midnights.    Desma Maxim MD [facility] Pager [phone]  If 7PM-7AM, please contact night-coverage [email or web address] Password [facility]  07/19/2021, 3:08 PM

## 2021-07-19 NOTE — ED Triage Notes (Signed)
Pt BIBA from home for weakness and fall. Pt's family reports they were unable to get a hold of him yesterday and today went to the house around 11 and found him on the floor. Pt states he got up this morning around 0730 and fell sometime this morning but unable to elaborate much. Denies head trauma. Endorses some weakness the last few days and difficulty walking due to chronic R knee pain. Endorses dysuria and dark urine. Denies CP or SHOB. Stroke screen negative. 20ga RH, 250cc NS en route.  140/100 HR 110 down to 90 RR 16 98% RA 140 CBG Hx dm

## 2021-07-20 ENCOUNTER — Encounter (HOSPITAL_COMMUNITY): Payer: Self-pay | Admitting: Obstetrics and Gynecology

## 2021-07-20 ENCOUNTER — Observation Stay (HOSPITAL_COMMUNITY): Payer: No Typology Code available for payment source

## 2021-07-20 DIAGNOSIS — E860 Dehydration: Principal | ICD-10-CM

## 2021-07-20 DIAGNOSIS — B029 Zoster without complications: Secondary | ICD-10-CM | POA: Diagnosis present

## 2021-07-20 DIAGNOSIS — Z7984 Long term (current) use of oral hypoglycemic drugs: Secondary | ICD-10-CM | POA: Diagnosis not present

## 2021-07-20 DIAGNOSIS — D72829 Elevated white blood cell count, unspecified: Secondary | ICD-10-CM | POA: Diagnosis present

## 2021-07-20 DIAGNOSIS — Z79899 Other long term (current) drug therapy: Secondary | ICD-10-CM | POA: Diagnosis not present

## 2021-07-20 DIAGNOSIS — M6282 Rhabdomyolysis: Secondary | ICD-10-CM | POA: Diagnosis present

## 2021-07-20 DIAGNOSIS — I129 Hypertensive chronic kidney disease with stage 1 through stage 4 chronic kidney disease, or unspecified chronic kidney disease: Secondary | ICD-10-CM | POA: Diagnosis present

## 2021-07-20 DIAGNOSIS — Z794 Long term (current) use of insulin: Secondary | ICD-10-CM | POA: Diagnosis not present

## 2021-07-20 DIAGNOSIS — N179 Acute kidney failure, unspecified: Secondary | ICD-10-CM

## 2021-07-20 DIAGNOSIS — W19XXXA Unspecified fall, initial encounter: Secondary | ICD-10-CM

## 2021-07-20 DIAGNOSIS — Z888 Allergy status to other drugs, medicaments and biological substances status: Secondary | ICD-10-CM | POA: Diagnosis not present

## 2021-07-20 DIAGNOSIS — E669 Obesity, unspecified: Secondary | ICD-10-CM | POA: Diagnosis present

## 2021-07-20 DIAGNOSIS — Z602 Problems related to living alone: Secondary | ICD-10-CM | POA: Diagnosis present

## 2021-07-20 DIAGNOSIS — R5381 Other malaise: Secondary | ICD-10-CM | POA: Diagnosis present

## 2021-07-20 DIAGNOSIS — M25461 Effusion, right knee: Secondary | ICD-10-CM | POA: Diagnosis present

## 2021-07-20 DIAGNOSIS — Y92009 Unspecified place in unspecified non-institutional (private) residence as the place of occurrence of the external cause: Secondary | ICD-10-CM

## 2021-07-20 DIAGNOSIS — N1832 Chronic kidney disease, stage 3b: Secondary | ICD-10-CM | POA: Diagnosis present

## 2021-07-20 DIAGNOSIS — Z7982 Long term (current) use of aspirin: Secondary | ICD-10-CM | POA: Diagnosis not present

## 2021-07-20 DIAGNOSIS — Z23 Encounter for immunization: Secondary | ICD-10-CM | POA: Diagnosis not present

## 2021-07-20 DIAGNOSIS — Z887 Allergy status to serum and vaccine status: Secondary | ICD-10-CM | POA: Diagnosis not present

## 2021-07-20 DIAGNOSIS — E1122 Type 2 diabetes mellitus with diabetic chronic kidney disease: Secondary | ICD-10-CM | POA: Diagnosis present

## 2021-07-20 DIAGNOSIS — Z880 Allergy status to penicillin: Secondary | ICD-10-CM | POA: Diagnosis not present

## 2021-07-20 DIAGNOSIS — Z6830 Body mass index (BMI) 30.0-30.9, adult: Secondary | ICD-10-CM | POA: Diagnosis not present

## 2021-07-20 DIAGNOSIS — Z20822 Contact with and (suspected) exposure to covid-19: Secondary | ICD-10-CM | POA: Diagnosis present

## 2021-07-20 DIAGNOSIS — E872 Acidosis, unspecified: Secondary | ICD-10-CM | POA: Diagnosis present

## 2021-07-20 LAB — CBC
HCT: 37.1 % — ABNORMAL LOW (ref 39.0–52.0)
Hemoglobin: 12.1 g/dL — ABNORMAL LOW (ref 13.0–17.0)
MCH: 29.1 pg (ref 26.0–34.0)
MCHC: 32.6 g/dL (ref 30.0–36.0)
MCV: 89.2 fL (ref 80.0–100.0)
Platelets: 162 10*3/uL (ref 150–400)
RBC: 4.16 MIL/uL — ABNORMAL LOW (ref 4.22–5.81)
RDW: 13.8 % (ref 11.5–15.5)
WBC: 12.6 10*3/uL — ABNORMAL HIGH (ref 4.0–10.5)
nRBC: 0 % (ref 0.0–0.2)

## 2021-07-20 LAB — GLUCOSE, CAPILLARY
Glucose-Capillary: 157 mg/dL — ABNORMAL HIGH (ref 70–99)
Glucose-Capillary: 225 mg/dL — ABNORMAL HIGH (ref 70–99)
Glucose-Capillary: 202 mg/dL — ABNORMAL HIGH (ref 70–99)
Glucose-Capillary: 146 mg/dL — ABNORMAL HIGH (ref 70–99)

## 2021-07-20 LAB — BASIC METABOLIC PANEL
Anion gap: 10 (ref 5–15)
BUN: 53 mg/dL — ABNORMAL HIGH (ref 8–23)
CO2: 20 mmol/L — ABNORMAL LOW (ref 22–32)
Calcium: 8.5 mg/dL — ABNORMAL LOW (ref 8.9–10.3)
Chloride: 113 mmol/L — ABNORMAL HIGH (ref 98–111)
Creatinine, Ser: 1.9 mg/dL — ABNORMAL HIGH (ref 0.61–1.24)
GFR, Estimated: 34 mL/min — ABNORMAL LOW (ref >60)
Glucose, Bld: 190 mg/dL — ABNORMAL HIGH (ref 70–99)
Potassium: 4.3 mmol/L (ref 3.5–5.1)
Sodium: 143 mmol/L (ref 135–145)

## 2021-07-20 LAB — CK: Total CK: 1674 U/L — ABNORMAL HIGH (ref 49–397)

## 2021-07-20 NOTE — Progress Notes (Signed)
PROGRESS NOTE    Antonio White  WUJ:[id number] DOB: 07/30/1932 DOA: 07/19/2021 PCP: Anson Fret, MD   Brief Narrative: 88year-old male lives alone with history of type 2 diabetes, hypertension, obesity, CKD stage IIIb admitted after a mechanical fall.  He denies loss of consciousness chest pain or headaches.  He recently had shingles affecting his right upper leg and his main complaint today is right knee pain.  Patient was on the floor for unknown duration of time until a family member found him and called EMS.  He was found to be in mild AKI and rhabdo.  He was admitted for further management.  Assessment & Plan:   Principal Problem:   AKI (acute kidney injury) (HCC) Active Problems:   Hypertension, essential   CKD stage G3b/A1, GFR 30-44 and albumin creatinine ratio <30 mg/g (HCC)   Obesity   T2DM (type 2 diabetes mellitus) (HCC)   Rhabdomyolysis   Fall at home, initial encounter    #1 AKI on CKD stage IIIb -cleared from dehydration decreased p.o. intake ACE inhibitor and being on the floor for prolonged period Of time.  Urine analysis shows ketones.   Improving  continue IV fluids. His creatinine is 1.9 from 2.01 on admission. Baseline creatinine around 1.7. CPK 1674 down from 2076 Check bladder scan to make sure he is not retaining urine Not on any diuretics at home Was on an ACE inhibitor prior to admission.   #2 leukocytosis White count on admission 17.5 down to 12.6 with hydration.  No signs of active infection noted.  Chest x-ray unremarkable. UA with ketones and protein no signs of infection noted    #3 status post fall at home with no loss of consciousness CT head unremarkable with no acute changes patient lives alone.  PT consult pending.  His main complaint today is right knee pain.  Will obtain right knee x-rays.  #4 type 2 diabetes continue insulin and SSI.  Holding glipizide and alogliptin. CBG (last 3)  Recent Labs    07/19/21 1715 07/19/21 2128  07/20/21 0743  GLUCAP 143* 163* 157*   #4 history of essential hypertension continue atenolol   Estimated body mass index is 30.82 kg/m as calculated from the following:   Height as of this encounter: 5\' 11"  (1.803 m).   Weight as of this encounter: 100.2 kg.  DVT prophylaxis: Lovenox  code Status: Full code  family Communication: None at bedside  disposition Plan:  Status is: Observation The patient remains OBS appropriate and will d/c before 2 midnights.  This patient admitted with falls and lives alone awaiting PT evaluation.   Consultants:  none  Procedures: none Antimicrobials: none  Subjective: He is resting in bed very weak and deconditioned and complaining of 10 out of 10 pain to his right knee.  Lives alone. Usually he follows up at  Objective: Vitals:   07/19/21 1712 07/19/21 2231 07/19/21 2304 07/20/21 0210  BP: (!) 182/80 (!) 186/80 138/62 (!) 144/50  Pulse: 90 86  77  Resp: 16 18  (!) 22  Temp: 98.6 F (37 C) 98.7 F (37.1 C)  98.9 F (37.2 C)  TempSrc: Oral Oral  Oral  SpO2: 98% 100%  99%  Weight:  100.2 kg    Height:  5\' 11"  (1.803 m)      Intake/Output Summary (Last 24 hours) at 07/20/2021 0949 Last data filed at 07/20/2021 0400 Gross per 24 hour  Intake 1499 ml  Output --  Net 1499 ml  Filed Weights   07/19/21 1217 07/19/21 2231  Weight: 99.8 kg 100.2 kg    Examination:  General exam: Appears in nad Respiratory system: Clear to auscultation. Respiratory effort normal. Cardiovascular system: S1 & S2 heard, RRR. No JVD, murmurs, rubs, gallops or clicks. No pedal edema. Gastrointestinal system: Abdomen is nondistended, soft and nontender. No organomegaly or masses felt. Normal bowel sounds heard. Central nervous system: Alert and oriented. No focal neurological deficits. Extremities: No edema.  No swelling to the right knee.  Decreased range of motion due to pain to right knee. Skin: No rashes, lesions or ulcers Psychiatry: Judgement  and insight appear normal. Mood & affect appropriate.     Data Reviewed: I have personally reviewed following labs and imaging studies  CBC: Recent Labs  Lab 07/19/21 1344 07/20/21 0353  WBC 17.5* 12.6*  NEUTROABS 15.3*  --   HGB 14.2 12.1*  HCT 42.1 37.1*  MCV 87.5 89.2  PLT 193 162   Basic Metabolic Panel: Recent Labs  Lab 07/19/21 1344 07/19/21 1938 07/20/21 0353  NA 142 143 143  K 4.7 4.0 4.3  CL 112* 112* 113*  CO2 18* 18* 20*  GLUCOSE 133* 170* 190*  BUN 47* 45* 53*  CREATININE 2.01* 1.87* 1.90*  CALCIUM 9.2 8.8* 8.5*   GFR: Estimated Creatinine Clearance: 33 mL/min (A) (by C-G formula based on SCr of 1.9 mg/dL (H)). Liver Function Tests: Recent Labs  Lab 07/19/21 1344  AST 71*  ALT 29  ALKPHOS 54  BILITOT 1.1  PROT 7.6  ALBUMIN 3.7   No results for input(s): "LIPASE", "AMYLASE" in the last 168 hours. No results for input(s): "AMMONIA" in the last 168 hours. Coagulation Profile: No results for input(s): "INR", "PROTIME" in the last 168 hours. Cardiac Enzymes: Recent Labs  Lab 07/19/21 1344 07/20/21 0353  CKTOTAL 2,076* 1,674*   BNP (last 3 results) No results for input(s): "PROBNP" in the last 8760 hours. HbA1C: Recent Labs    07/19/21 1723  HGBA1C 7.1*   CBG: Recent Labs  Lab 07/19/21 1224 07/19/21 1715 07/19/21 2128 07/20/21 0743  GLUCAP 116* 143* 163* 157*   Lipid Profile: No results for input(s): "CHOL", "HDL", "LDLCALC", "TRIG", "CHOLHDL", "LDLDIRECT" in the last 72 hours. Thyroid Function Tests: No results for input(s): "TSH", "T4TOTAL", "FREET4", "T3FREE", "THYROIDAB" in the last 72 hours. Anemia Panel: No results for input(s): "VITAMINB12", "FOLATE", "FERRITIN", "TIBC", "IRON", "RETICCTPCT" in the last 72 hours. Sepsis Labs: Recent Labs  Lab 07/19/21 1317 07/19/21 1656  LATICACIDVEN 2.1* 1.5    Recent Results (from the past 240 hour(s))  SARS Coronavirus 2 by RT PCR (hospital order, performed in [facility]  hospital lab) *cepheid single result test* Anterior Nasal Swab     Status: None   Collection Time: 07/19/21 12:34 PM   Specimen: Anterior Nasal Swab  Result Value Ref Range Status   SARS Coronavirus 2 by RT PCR NEGATIVE NEGATIVE Final    Comment: (NOTE) SARS-CoV-2 target nucleic acids are NOT DETECTED.  The SARS-CoV-2 RNA is generally detectable in upper and lower respiratory specimens during the acute phase of infection. The lowest concentration of SARS-CoV-2 viral copies this assay can detect is 250 copies / mL. A negative result does not preclude SARS-CoV-2 infection and should not be used as the sole basis for treatment or other patient management decisions.  A negative result may occur with improper specimen collection / handling, submission of specimen other than nasopharyngeal swab, presence of viral mutation(s) within the areas targeted by this  assay, and inadequate number of viral copies (<250 copies / mL). A negative result must be combined with clinical observations, patient history, and epidemiological information.  Fact Sheet for Patients:   RoadLapTop.co.za  Fact Sheet for Healthcare Providers: http://kim-miller.com/  This test is not yet approved or  cleared by the [place] FDA and has been authorized for detection and/or diagnosis of SARS-CoV-2 by FDA under an Emergency Use Authorization (EUA).  This EUA will remain in effect (meaning this test can be used) for the duration of the COVID-19 declaration under Section 564(b)(1) of the Act, 21 U.S.C. section 360bbb-3(b)(1), unless the authorization is terminated or revoked sooner.  Performed at [facility], [address], [place], [place] [address]   Culture, blood (Routine X 2) w Reflex to ID Panel     Status: None (Preliminary result)   Collection Time: 07/19/21  1:14 PM   Specimen: BLOOD  Result Value Ref Range Status   Specimen Description    Final    BLOOD RIGHT ANTECUBITAL Performed at [facility], [address] [place], [place] [address]    Special Requests   Final    BOTTLES DRAWN AEROBIC AND ANAEROBIC Blood Culture results may not be optimal due to an excessive volume of blood received in culture bottles Performed at [facility], [address] [place], [place] [address]    Culture   Final    NO GROWTH < 24 HOURS Performed at [facility], [address], [place], [place] [address]    Report Status PENDING  Incomplete  Culture, blood (Routine X 2) w Reflex to ID Panel     Status: None (Preliminary result)   Collection Time: 07/19/21  1:17 PM   Specimen: BLOOD  Result Value Ref Range Status   Specimen Description   Final    BLOOD Blood Culture adequate volume Performed at [facility] [place] [facility], [address], [place], [place] [address]    Special Requests   Final    BOTTLES DRAWN AEROBIC AND ANAEROBIC Blood Culture adequate volume Performed at [facility], [address] [place], [place] [address]    Culture   Final    NO GROWTH < 24 HOURS Performed at [facility], [address], [place], [place] [address]    Report Status PENDING  Incomplete         Radiology Studies: CT Head Wo Contrast  Result Date: 07/19/2021 CLINICAL DATA:  Mental status change EXAM: CT HEAD WITHOUT CONTRAST TECHNIQUE: Contiguous axial images were obtained from the base of the skull through the vertex without intravenous contrast. RADIATION DOSE REDUCTION: This exam was performed according to the departmental dose-optimization program which includes automated exposure control, adjustment of the mA and/or kV according to patient size and/or use of iterative reconstruction technique. COMPARISON:  None Available. FINDINGS: Brain: No evidence of acute infarction, hemorrhage, hydrocephalus, extra-axial collection or mass lesion/mass effect. Vascular:  Calcified atherosclerotic changes in the intracranial carotids. Skull: Normal. Negative for fracture or focal lesion. Sinuses/Orbits: No acute finding. Other: None. IMPRESSION: No acute intracranial abnormalities.  Mild white matter changes. Electronically Signed   By: Gerome Sam III M.D.   On: 07/19/2021 18:56   DG Chest Port 1 View  Result Date: 07/19/2021 CLINICAL DATA:  Shortness of breath EXAM: PORTABLE CHEST 1 VIEW COMPARISON:  None Available. FINDINGS: The heart size and mediastinal contours are within normal limits. Both lungs are clear. The visualized skeletal structures  are unremarkable. IMPRESSION: No active disease. Electronically Signed   By: Duanne Guess D.O.   On: 07/19/2021 14:00        Scheduled Meds:  atenolol  100 mg Oral Daily   enoxaparin (LOVENOX) injection  40 mg Subcutaneous Q24H   famotidine  20 mg Oral QHS   feeding supplement  237 mL Oral BID BM   insulin aspart  0-15 Units Subcutaneous TID WC   insulin glargine-yfgn  45 Units Subcutaneous QHS   latanoprost  1 drop Both Eyes QHS   timolol  1 drop Right Eye q morning   Continuous Infusions:  lactated ringers 125 mL/hr at 07/20/21 0400     LOS: 0 days    Time spent: 46 mi n  Alwyn Ren, MD 07/20/2021, 9:49 AM

## 2021-07-20 NOTE — Evaluation (Signed)
Occupational Therapy Evaluation Patient Details Name: Antonio White MRN: [id number] DOB: 15-Jun-1932 Today's Date: 07/20/2021   History of Present Illness 88year-old male lives alone with history of type 2 diabetes, hypertension, obesity, CKD stage IIIb admitted after a mechanical fall.  He denies loss of consciousness chest pain or headaches.  He recently had shingles affecting his right upper leg and his main complaint today is right knee pain.  Patient was on the floor for unknown duration of time until a family member found him and called EMS.  He was found to be in mild AKI and rhabdo.   Clinical Impression   Antonio White is an 88 year old man typically independent at home now presents with generalized weakness, decreased activity tolerance, impaired balance and pain. He appears to have some memory deficits - as he could not remember the month and also though he was at a hospital in [place].  On evaluation he requires increased assistance with upper body ADLs at bed level and msx-total assist for LB ADLs at bed level. He was max assist to transfer into sitting, mod x 2 to power up from elevated bed height and min assist to take steps to [facility] with RW. BSC could not be raised and patient on lower surface. He could not stand despite max x 2 physical assistance, use of stedy, use of back of recliner, facilitating propulsion from underneath thigh by therapist and ultimately had to be lifted back to bed. He was max assist to roll in bed. He was total assist for toileting. Patient will benefit from skilled OT services while in hospital to improve deficits and learn compensatory strategies as needed in order to return to PLOF.  Patient will need rehab at discharge.     Recommendations for follow up therapy are one component of a multi-disciplinary discharge planning process, led by the attending physician.  Recommendations may be updated based on patient status, additional functional criteria and  insurance authorization.   Follow Up Recommendations  Skilled nursing-short term rehab (<3 hours/day)    Assistance Recommended at Discharge Frequent or constant Supervision/Assistance  Patient can return home with the following Two people to help with walking and/or transfers;A lot of help with bathing/dressing/bathroom;Direct supervision/assist for financial management;Assist for transportation;Help with stairs or ramp for entrance;Direct supervision/assist for medications management    Functional Status Assessment  Patient has had a recent decline in their functional status and demonstrates the ability to make significant improvements in function in a reasonable and predictable amount of time.  Equipment Recommendations  Other (comment) (TBD)    Recommendations for Other Services       Precautions / Restrictions Precautions Precautions: Fall Precaution Comments: R knee pain, needs +2 to stand from high perch, needs lift pad from [facility] and chair Restrictions Weight Bearing Restrictions: No      Mobility Bed Mobility Overal bed mobility: Needs Assistance Bed Mobility: Supine to Sit, Sit to Supine, Rolling Rolling: Max assist   Supine to sit: Max assist, HOB elevated Sit to supine: Total assist, +2 for physical assistance        Transfers Overall transfer level: Needs assistance Equipment used: Rolling walker (2 wheels) Transfers: Sit to/from Stand, Bed to chair/wheelchair/BSC Sit to Stand: Mod assist, +2 physical assistance, From elevated surface           General transfer comment: Patient mod x 2 to power up in to stand with walker from elevated bed height. Min assist to take steps with walker to [facility]  with +2 for safety. Multiple attempts to stand from [facility] in which patient's hip and knees at the same height (BSC could not be raised). Unable to power up with max x 2 from therapists x 2-3 attempts, Unable to stand with use of stedy x 2 attempts. Unable to stand with use of  back of reclienr x 2 attempts. Unable to get him to stand with back of recliner and therapist providing propulsion under his thigh with hers. Ultimately had to use lift to get patient back to bed. Transfer via Lift Equipment: Multimedia programmer Overall balance assessment: Needs assistance Sitting-balance support: No upper extremity supported, Feet supported Sitting balance-Leahy Scale: Good     Standing balance support: During functional activity Standing balance-Leahy Scale: Poor                             ADL either performed or assessed with clinical judgement   ADL Overall ADL's : Needs assistance/impaired Eating/Feeding: Set up   Grooming: Set up;Bed level;Minimal assistance   Upper Body Bathing: Set up;Bed level   Lower Body Bathing: Maximal assistance;Bed level   Upper Body Dressing : Minimal assistance;Bed level   Lower Body Dressing: Total assistance;Bed level   Toilet Transfer: +2 for physical assistance;Total assistance;BSC/3in1 Toilet Transfer Details (indicate cue type and reason): +2 to assist with descent onto [facility], lift required to stand from [facility] Toileting- Clothing Manipulation and Hygiene: Maximal assistance;Sitting/lateral lean       Functional mobility during ADLs: +2 for physical assistance       Vision Patient Visual Report: No change from baseline       Perception     Praxis      Pertinent Vitals/Pain Pain Assessment Pain Assessment: Faces Faces Pain Scale: Hurts little more Pain Location: R knee - weight bearing Pain Descriptors / Indicators: Grimacing, Guarding     Hand Dominance     Extremity/Trunk Assessment Upper Extremity Assessment Upper Extremity Assessment: RUE deficits/detail;LUE deficits/detail RUE Deficits / Details: 3-/5 shoulder, elbow, 4/5, wrist 5/5, grip 4/5 RUE Sensation: WNL RUE Coordination: WNL LUE Deficits / Details: 3-/5 shoulder, 4-/5, wrist 5/5, 4-/5 grip LUE Sensation: WNL LUE Coordination:  WNL   Lower Extremity Assessment Lower Extremity Assessment: Defer to PT evaluation   Cervical / Trunk Assessment Cervical / Trunk Assessment: Normal   Communication Communication Communication: No difficulties   Cognition Arousal/Alertness: Awake/alert Behavior During Therapy: WFL for tasks assessed/performed Overall Cognitive Status: Within Functional Limits for tasks assessed                                 General Comments: Able to answer PLOF questions, Is alert to self and year. Had to be corrected on Month. Knew the President. Grossly knew current situation. Thought he was in [place].     General Comments       Exercises     Shoulder Instructions      Home Living Family/patient expects to be discharged to:: Private residence Living Arrangements: Alone   Type of Home: Skilled Nursing Facility Home Access: Level entry     Home Layout: One level               Home Equipment: Agricultural consultant (2 wheels);Cane - single point;BSC/3in1          Prior Functioning/Environment  Mobility Comments: using RW since fall ADLs Comments: mod ind with bathing, dressing. still drives        OT Problem List: Decreased strength;Decreased activity tolerance;Decreased safety awareness;Decreased range of motion;Decreased knowledge of use of DME or AE;Impaired UE functional use;Pain      OT Treatment/Interventions: Self-care/ADL training;Therapeutic exercise;DME and/or AE instruction;Therapeutic activities;Balance training;Patient/family education    OT Goals(Current goals can be found in the care plan section) Acute Rehab OT Goals Patient Stated Goal: to walk to bathroom OT Goal Formulation: With patient Time For Goal Achievement: 08/03/21 Potential to Achieve Goals: Good  OT Frequency: Min 2X/week    Co-evaluation PT/OT/SLP Co-Evaluation/Treatment: Yes (co-eval) Reason for Co-Treatment: For patient/therapist safety;To address  functional/ADL transfers          AM-PAC OT "6 Clicks" Daily Activity     Outcome Measure Help from another person eating meals?: None Help from another person taking care of personal grooming?: A Little Help from another person toileting, which includes using toliet, bedpan, or urinal?: Total Help from another person bathing (including washing, rinsing, drying)?: A Lot Help from another person to put on and taking off regular upper body clothing?: A Little Help from another person to put on and taking off regular lower body clothing?: Total 6 Click Score: 14   End of Session Equipment Utilized During Treatment: Rolling walker (2 wheels);Gait belt;Other (comment) (stedy, hoyer lift) Nurse Communication: Mobility status  Activity Tolerance: Patient tolerated treatment well Patient left: in bed;with call bell/phone within reach;with bed alarm set  OT Visit Diagnosis: Muscle weakness (generalized) (M62.81);Pain;Other abnormalities of gait and mobility (R26.89) Pain - Right/Left: Right Pain - part of body: Knee                Time: 1610-9604 OT Time Calculation (min): 66 min Charges:  OT General Charges $OT Visit: 1 Visit OT Evaluation $OT Eval Low Complexity: 1 Low OT Treatments $Self Care/Home Management : 8-22 mins  Tabria Steines, OTR/L Acute Care Rehab Services  Office [phone] Pager: [phone]   Kelli Churn 07/20/2021, 1:13 PM

## 2021-07-20 NOTE — Evaluation (Signed)
Physical Therapy Evaluation Patient Details Name: Antonio White MRN: [id number] DOB: [birth year] Today's Date: 07/20/2021  History of Present Illness  88year-old male lives alone with history of type 2 diabetes, hypertension, obesity, CKD stage IIIb admitted after a mechanical fall.  He denies loss of consciousness chest pain or headaches.  He recently had shingles affecting his right upper leg and his main complaint today is right knee pain.  Patient was on the floor for unknown duration of time until a family member found him and called EMS.  He was found to be in mild AKI and rhabdo.  Clinical Impression  On eval, pt required Mod +2 to stand from highly elevated bed surface with RW. Unfortunately, after getting pt over to bsc, pt was unable to stand even with Max A +2. Eventually had to resort to using lift equipment for transfer back to bed. Pt reported moderate R knee pain during session but he is very weak and fatigues very quickly/easily. PT recommendation is for ST SNF rehab.        Recommendations for follow up therapy are one component of a multi-disciplinary discharge planning process, led by the attending physician.  Recommendations may be updated based on patient status, additional functional criteria and insurance authorization.  Follow Up Recommendations Skilled nursing-short term rehab (<3 hours/day) Can patient physically be transported by private vehicle: No    Assistance Recommended at Discharge Frequent or constant Supervision/Assistance  Patient can return home with the following  Two people to help with walking and/or transfers;Two people to help with bathing/dressing/bathroom;Help with stairs or ramp for entrance;Assistance with cooking/housework;Assist for transportation    Equipment Recommendations None recommended by PT  Recommendations for Other Services  OT consult    Functional Status Assessment Patient has had a recent decline in their functional status and  demonstrates the ability to make significant improvements in function in a reasonable and predictable amount of time.     Precautions / Restrictions Precautions Precautions: Fall Precaution Comments: R knee pain, needs +2 to stand from highly elevated surface, needs lift pad from [facility] and chair Restrictions Weight Bearing Restrictions: No      Mobility  Bed Mobility Overal bed mobility: Needs Assistance Bed Mobility: Supine to Sit, Sit to Supine, Rolling Rolling: Mod assist   Supine to sit: Max assist     General bed mobility comments: Max A for trunk and bil LEs. Utilized bedpad to aid with scooting, positioning at EOB. Increased time. Cues provided. Pt seemed to have overall general weakness in addition to painful R knee    Transfers Overall transfer level: Needs assistance Equipment used: Rolling walker (2 wheels) Transfers: Sit to/from Stand Sit to Stand: Mod assist, +2 physical assistance, From elevated surface           General transfer comment: Patient mod x 2 to power up in to stand with walker from elevated bed height. Min assist to take steps with walker to [facility] with +2 for safety. Multiple attempts to stand from [facility] in which patient's hip and knees at the same height (BSC could not be raised). Unable to power up with max x 2 from therapists x 2-3 attempts, Unable to stand with use of stedy x 2 attempts. Unable to stand with use of back of reclienr x 2 attempts. Unable to get him to stand with back of recliner and therapist providing propulsion under his thigh with hers. Ultimately had to use lift to get patient back to bed.    Ambulation/Gait  Stairs            Wheelchair Mobility    Modified [name] (Stroke Patients Only)       Balance Overall balance assessment: Needs assistance Sitting-balance support: Bilateral upper extremity supported, Feet supported Sitting balance-Leahy Scale: Fair     Standing balance support: During  functional activity, Bilateral upper extremity supported, Reliant on assistive device for balance Standing balance-Leahy Scale: Poor                               Pertinent Vitals/Pain Pain Assessment Pain Assessment: Faces Faces Pain Scale: Hurts little more Pain Location: R knee - weight bearing Pain Descriptors / Indicators: Grimacing, Guarding Pain Intervention(s): Limited activity within patient's tolerance, Monitored during session, Repositioned    Home Living Family/patient expects to be discharged to:: Private residence Living Arrangements: Alone   Type of Home: Skilled Nursing Facility Home Access: Level entry       Home Layout: One level Home Equipment: Agricultural consultant (2 wheels);Cane - single point;BSC/3in1      Prior Function               Mobility Comments: using RW since fall ADLs Comments: mod ind with bathing, dressing. still drives     Hand Dominance        Extremity/Trunk Assessment   Upper Extremity Assessment Upper Extremity Assessment: Defer to OT evaluation RUE Deficits / Details: 3-/5 shoulder, elbow, 4/5, wrist 5/5, grip 4/5 RUE Sensation: WNL RUE Coordination: WNL LUE Deficits / Details: 3-/5 shoulder, 4-/5, wrist 5/5, 4-/5 grip LUE Sensation: WNL LUE Coordination: WNL    Lower Extremity Assessment Lower Extremity Assessment: Generalized weakness (R LE swelling. R knee ext 3-/5. L LE strength at leat 3/5)    Cervical / Trunk Assessment Cervical / Trunk Assessment: Normal  Communication   Communication: No difficulties  Cognition Arousal/Alertness: Awake/alert Behavior During Therapy: WFL for tasks assessed/performed                                   General Comments: Able to answer PLOF questions, Is alert to self and year. Had to be corrected on Month. Knew the President. Grossly knew current situation. Thought he was in [place].        General Comments      Exercises     Assessment/Plan     PT Assessment Patient needs continued PT services  PT Problem List Decreased strength;Decreased balance;Decreased range of motion;Decreased activity tolerance;Decreased mobility;Decreased knowledge of use of DME;Pain       PT Treatment Interventions DME instruction;Gait training;Functional mobility training;Therapeutic activities;Balance training;Patient/family education;Therapeutic exercise    PT Goals (Current goals can be found in the Care Plan section)  Acute Rehab PT Goals Patient Stated Goal: less pain. get strength back PT Goal Formulation: With patient Time For Goal Achievement: 08/03/21 Potential to Achieve Goals: Fair    Frequency Min 2X/week     Co-evaluation   Reason for Co-Treatment: For patient/therapist safety;To address functional/ADL transfers           AM-PAC PT "6 Clicks" Mobility  Outcome Measure Help needed turning from your back to your side while in a flat bed without using bedrails?: A Lot Help needed moving from lying on your back to sitting on the side of a flat bed without using bedrails?: A Lot Help needed moving to and from a  bed to a chair (including a wheelchair)?: Total Help needed standing up from a chair using your arms (e.g., wheelchair or bedside chair)?: Total Help needed to walk in hospital room?: Total Help needed climbing 3-5 steps with a railing? : Total 6 Click Score: 8    End of Session Equipment Utilized During Treatment: Gait belt Activity Tolerance: Patient limited by fatigue;Patient limited by pain Patient left: in bed;with call bell/phone within reach;with bed alarm set   PT Visit Diagnosis: Muscle weakness (generalized) (M62.81);Pain;Difficulty in walking, not elsewhere classified (R26.2);History of falling (Z91.81) Pain - Right/Left: Right Pain - part of body: Knee    Time: 1125-1231 PT Time Calculation (min) (ACUTE ONLY): 66 min   Charges:   PT Evaluation $PT Eval Moderate Complexity: 1 Mod PT  Treatments $Therapeutic Activity: 8-22 mins          Faye Ramsay, PT Acute Rehabilitation  Office: [phone] Pager: [phone]

## 2021-07-20 NOTE — Progress Notes (Signed)
Attempted to assist patient up to [facility], patient unable to bear weight on right leg, up with 2 max assist.  Patient unable to stand fromBSC with 2 max assist, had to have a third person to stand patient up and quickly pull bed under him. Patient states he was living alone and able to walk with walker prior to admit.

## 2021-07-20 NOTE — Progress Notes (Signed)
Bladder scan performed as per order, 175 mls in bladder. Patient wearing primofit, states no urge to void at this time.  200 mls yellow urine in cannister as well.  MD notified.

## 2021-07-21 DIAGNOSIS — N179 Acute kidney failure, unspecified: Secondary | ICD-10-CM | POA: Diagnosis not present

## 2021-07-21 LAB — BASIC METABOLIC PANEL
Anion gap: 7 (ref 5–15)
BUN: 51 mg/dL — ABNORMAL HIGH (ref 8–23)
CO2: 21 mmol/L — ABNORMAL LOW (ref 22–32)
Calcium: 8.6 mg/dL — ABNORMAL LOW (ref 8.9–10.3)
Chloride: 114 mmol/L — ABNORMAL HIGH (ref 98–111)
Creatinine, Ser: 1.77 mg/dL — ABNORMAL HIGH (ref 0.61–1.24)
GFR, Estimated: 37 mL/min — ABNORMAL LOW (ref >60)
Glucose, Bld: 150 mg/dL — ABNORMAL HIGH (ref 70–99)
Potassium: 4.1 mmol/L (ref 3.5–5.1)
Sodium: 142 mmol/L (ref 135–145)

## 2021-07-21 LAB — CBC
HCT: 36.3 % — ABNORMAL LOW (ref 39.0–52.0)
Hemoglobin: 12.1 g/dL — ABNORMAL LOW (ref 13.0–17.0)
MCH: 29.6 pg (ref 26.0–34.0)
MCHC: 33.3 g/dL (ref 30.0–36.0)
MCV: 88.8 fL (ref 80.0–100.0)
Platelets: 142 10*3/uL — ABNORMAL LOW (ref 150–400)
RBC: 4.09 MIL/uL — ABNORMAL LOW (ref 4.22–5.81)
RDW: 13.9 % (ref 11.5–15.5)
WBC: 10.4 10*3/uL (ref 4.0–10.5)
nRBC: 0 % (ref 0.0–0.2)

## 2021-07-21 LAB — GLUCOSE, CAPILLARY
Glucose-Capillary: 141 mg/dL — ABNORMAL HIGH (ref 70–99)
Glucose-Capillary: 191 mg/dL — ABNORMAL HIGH (ref 70–99)
Glucose-Capillary: 222 mg/dL — ABNORMAL HIGH (ref 70–99)
Glucose-Capillary: 185 mg/dL — ABNORMAL HIGH (ref 70–99)

## 2021-07-21 LAB — CK: Total CK: 845 U/L — ABNORMAL HIGH (ref 49–397)

## 2021-07-21 MED ORDER — VARICELLA VIRUS VACCINE LIVE 1350 PFU/0.5ML ~~LOC~~ INJ
0.5000 mL | INJECTION | Freq: Once | SUBCUTANEOUS | Status: AC
Start: 2021-07-21 — End: 2021-07-21
  Administered 2021-07-21: 0.5 mL via SUBCUTANEOUS
  Filled 2021-07-21: qty 0.5

## 2021-07-21 NOTE — Progress Notes (Signed)
Nutrition Brief Note  Patient identified on the Malnutrition Screening Tool (MST) Report; score of 2.0  Wt Readings from Last 15 Encounters:  07/19/21 100.2 kg    Body mass index is 30.82 kg/m. Patient meets criteria for obesity based on current BMI. No other weight recordings available in the chart. Skin WDL. No information documented in the edema section of flow sheet.  Patient admitted due to fall at home and AKI on stage 3 CKD. MD note from today states patient is medically stable pending d/c to SNF; family prefers [facility].  Current diet order is Carb Modified. Patient ate 25% of dinner last night and 100% of breakfast this AM. Labs and medications reviewed.   No nutrition interventions warranted at this time. If nutrition issues arise, please consult RD.      Trenton Gammon, MS, RD, LDN, CNSC Registered Dietitian II Inpatient Clinical Nutrition RD pager # and on-call/weekend pager # available in [facility]

## 2021-07-21 NOTE — TOC Initial Note (Signed)
Transition of Care [facility]) - Initial/Assessment Note    Patient Details  Name: Antonio White MRN: [id number] Date of Birth: August 24, 1932  Transition of Care [facility]) CM/SW Contact:    Darleene Cleaver, LCSW Phone Number: 07/21/2021, 10:46 PM  Clinical Narrative:                  Patient is an 88 year old male who is alert and oriented x4.  CSW was informed that patient lives alone and would like to go to SNF for short term rehab before returning back home.  Per physician patient would like to go to [facility] for rehab.  CSW to begin bed search in [facility].  Patient will also need insurance authorization before returning home.  Expected Discharge Plan: Skilled Nursing Facility Barriers to Discharge: Insurance Authorization   Patient Goals and CMS Choice Patient states their goals for this hospitalization and ongoing recovery are:: To go to SNF for rehab then return back home.      Expected Discharge Plan and Services Expected Discharge Plan: Skilled Nursing Facility     Post Acute Care Choice: Skilled Nursing Facility Living arrangements for the past 2 months: Single Family Home                                      Prior Living Arrangements/Services Living arrangements for the past 2 months: Single Family Home Lives with:: Self Patient language and need for interpreter reviewed:: Yes Do you feel safe going back to the place where you live?: No   Patient feels he needs SNF for rehab, before returning back home.  Need for Family Participation in Patient Care: No (Comment) Care giver support system in place?: No (comment)   Criminal Activity/Legal Involvement Pertinent to Current Situation/Hospitalization: No - Comment as needed  Activities of Daily Living Home Assistive Devices/Equipment: None ADL Screening (condition at time of admission) Patient's cognitive ability adequate to safely complete daily activities?: Yes Is the patient deaf or have difficulty  hearing?: No Does the patient have difficulty seeing, even when wearing glasses/contacts?: No Does the patient have difficulty concentrating, remembering, or making decisions?: Yes Patient able to express need for assistance with ADLs?: Yes Does the patient have difficulty dressing or bathing?: Yes Independently performs ADLs?: No Does the patient have difficulty walking or climbing stairs?: Yes Weakness of Legs: Both Weakness of Arms/Hands: None  Permission Sought/Granted Permission sought to share information with : Case Manager, Family Supports Permission granted to share information with : Yes, Release of Information Signed  Share Information with NAME: Donzel, Romack   [phone]  Carroll,Shena/Anthony Relative   [phone]  Permission granted to share info w AGENCY: SNF admissions        Emotional Assessment Appearance:: Appears stated age   Affect (typically observed): Accepting, Calm, Stable Orientation: : Oriented to Self, Oriented to Place, Oriented to Situation, Oriented to  Time Alcohol / Substance Use: Not Applicable Psych Involvement: No (comment)  Admission diagnosis:  Dehydration [E86.0] AKI (acute kidney injury) (HCC) [N17.9] Patient Active Problem List   Diagnosis Date Noted   Dehydration    Hypertension, essential 07/19/2021   CKD stage G3b/A1, GFR 30-44 and albumin creatinine ratio <30 mg/g (HCC) 07/19/2021   Obesity 07/19/2021   T2DM (type 2 diabetes mellitus) (HCC) 07/19/2021   Primary open-angle glaucoma, right eye, moderate stage 07/19/2021   AKI (acute kidney injury) (HCC) 07/19/2021  Rhabdomyolysis 07/19/2021   Fall at home, initial encounter 07/19/2021   PCP:  Anson Fret, MD Pharmacy:   [facility] - [place], [place] - [address] [place] [address] Phone: [phone] Fax: [phone]     Social Determinants of Health (SDOH) Interventions     Readmission Risk Interventions     No data to display

## 2021-07-21 NOTE — [place] FL2 (Addendum)
MEDICAID FL2 LEVEL OF CARE SCREENING TOOL     IDENTIFICATION  Patient Name: Antonio White Birthdate: [birth year] Sex: male Admission Date (Current Location): 07/19/2021  [facility] and [place] Number:  Producer, television/film/video and Address:  [facility],  [address] [place], [place] [address]      Provider Number: [id number]  Attending Physician Name and Address:  Alwyn Ren, MD  Relative Name and Phone Number:       Current Level of Care:   Recommended Level of Care:   Prior Approval Number:    Date Approved/Denied:   PASRR Number: [id number]  Discharge Plan: SNF    Current Diagnoses: Patient Active Problem List   Diagnosis Date Noted   Dehydration    Hypertension, essential 07/19/2021   CKD stage G3b/A1, GFR 30-44 and albumin creatinine ratio <30 mg/g (HCC) 07/19/2021   Obesity 07/19/2021   T2DM (type 2 diabetes mellitus) (HCC) 07/19/2021   Primary open-angle glaucoma, right eye, moderate stage 07/19/2021   AKI (acute kidney injury) (HCC) 07/19/2021   Rhabdomyolysis 07/19/2021   Fall at home, initial encounter 07/19/2021    Orientation RESPIRATION BLADDER Height & Weight     Self, Time, Situation, Place  Normal Incontinent Weight: 221 lb (100.2 kg) Height:  5\' 11"  (180.3 cm)  BEHAVIORAL SYMPTOMS/MOOD NEUROLOGICAL BOWEL NUTRITION STATUS      Continent Diet (Regular)  AMBULATORY STATUS COMMUNICATION OF NEEDS Skin   Limited Assist Verbally Skin abrasions                       Personal Care Assistance Level of Assistance  Bathing, Dressing, Feeding Bathing Assistance: Limited assistance Feeding assistance: Independent Dressing Assistance: Limited assistance     Functional Limitations Info  Sight, Hearing, Speech Sight Info: Adequate Hearing Info: Adequate Speech Info: Adequate    SPECIAL CARE FACTORS FREQUENCY  PT (By licensed PT), OT (By licensed OT)     PT Frequency: Minimum 5x a week OT Frequency: Minimum 5x a week             Contractures Contractures Info: Not present    Additional Factors Info  Code Status, Allergies, Insulin Sliding Scale Code Status Info: Full Code Allergies Info: Metformin And Related   Penicillins   Pneumococcal Vaccine   Insulin Sliding Scale Info: insulin aspart (novoLOG) injection 0-15 Units 3x a day with meals       Current Medications (07/21/2021):  This is the current hospital active medication list Current Facility-Administered Medications  Medication Dose Route Frequency Provider Last Rate Last Admin   atenolol (TENORMIN) tablet 100 mg  100 mg Oral Daily 07/23/2021, MD   100 mg at 07/21/21 0819   enoxaparin (LOVENOX) injection 40 mg  40 mg Subcutaneous Q24H 07/23/21, MD   40 mg at 07/21/21 1753   famotidine (PEPCID) tablet 20 mg  20 mg Oral QHS 07/23/21, MD   20 mg at 07/21/21 2113   feeding supplement (ENSURE ENLIVE / ENSURE PLUS) liquid 237 mL  237 mL Oral BID BM 2114, MD   237 mL at 07/21/21 1507   hydrALAZINE (APRESOLINE) injection 10 mg  10 mg Intravenous Q2H PRN 07/23/21, MD   10 mg at 07/19/21 2241   insulin aspart (novoLOG) injection 0-15 Units  0-15 Units Subcutaneous TID WC Wouk, 03-06-1982, MD   5 Units at 07/21/21 1752   insulin glargine-yfgn (SEMGLEE) injection 45 Units  45 Units  Subcutaneous QHS Kathrynn Running, MD   45 Units at 07/21/21 2121   lactated ringers infusion   Intravenous Continuous Alwyn Ren, MD 75 mL/hr at 07/21/21 0759 New Bag at 07/21/21 0759   latanoprost (XALATAN) 0.005 % ophthalmic solution 1 drop  1 drop Both Eyes QHS Wouk, Wilfred Curtis, MD   1 drop at 07/21/21 2116   timolol (TIMOPTIC-XR) 0.5 % ophthalmic gel-forming 1 drop  1 drop Right Eye q morning Wouk, Wilfred Curtis, MD   1 drop at 07/21/21 2376     Discharge Medications: Please see discharge summary for a list of discharge medications.  Relevant Imaging Results:  Relevant Lab Results:   Additional  Information SSN [id number]  Darleene Cleaver, LCSW

## 2021-07-21 NOTE — Progress Notes (Signed)
PROGRESS NOTE    Yoshimi Sarr  GUY:[id number] DOB: 1932-04-04 DOA: 07/19/2021 PCP: Anson Fret, MD   Brief Narrative: 88year-old male lives alone with history of type 2 diabetes, hypertension, obesity, CKD stage IIIb admitted after a mechanical fall.  He denies loss of consciousness chest pain or headaches.  He recently had shingles affecting his right upper leg and his main complaint today is right knee pain.  Patient was on the floor for unknown duration of time until a family member found him and called EMS.  He was found to be in mild AKI and rhabdo.  He was admitted for further management.  Assessment & Plan:   Principal Problem:   AKI (acute kidney injury) (HCC) Active Problems:   Hypertension, essential   CKD stage G3b/A1, GFR 30-44 and albumin creatinine ratio <30 mg/g (HCC)   Obesity   T2DM (type 2 diabetes mellitus) (HCC)   Rhabdomyolysis   Fall at home, initial encounter   Dehydration    #1 AKI on CKD stage IIIb -from dehydration decreased p.o. intake ACE inhibitor and being on the floor for prolonged period Of time.  Urine analysis shows ketones.   Improving  dc IV fluids. His creatinine is 1.77 from 1.9 from 2.01 on admission. Baseline creatinine around 1.7. CPK 845 from 1674 down from 2076 Bladder scan with no urinary retention.  #2 leukocytosis resolved.  White count on admission 17.5 down to 12.6 with hydration.  No signs of active infection noted.  Chest x-ray unremarkable. UA with ketones and protein no signs of infection noted    #3 status post fall at home with no loss of consciousness CT head unremarkable with no acute changes patient lives alone.  PT consult recommending SNF.  His main complaint today is right knee pain.  Right knee x-ray shows some effusion small knee joint effusion otherwise no acute abnormality.  #4 type 2 diabetes continue insulin and SSI.  Holding glipizide and alogliptin. CBG (last 3)  Recent Labs    07/20/21 2212  07/21/21 0756 07/21/21 1153  GLUCAP 146* 141* 191*    #4 history of essential hypertension continue atenolol   Estimated body mass index is 30.82 kg/m as calculated from the following:   Height as of this encounter: 5\' 11"  (1.803 m).   Weight as of this encounter: 100.2 kg.  DVT prophylaxis: Lovenox  code Status: Full code  family Communication: None at bedside  disposition Plan:  Status is: Patient is medically stable to be discharged to SNF family prefers form Consultants:  none  Procedures: none Antimicrobials: none  Subjective:   He is resting in bed in no acute distress requesting shingles vaccine feels very weak cannot go home and live alone requesting to go to [facility]  Objective: Vitals:   07/20/21 1044 07/20/21 2217 07/21/21 0432 07/21/21 0800  BP: (!) 163/59 (!) 145/63 (!) 159/85 (!) 168/74  Pulse: 75 64 61 61  Resp: 18 20 18    Temp:  98 F (36.7 C) 98.1 F (36.7 C)   TempSrc:  Oral Oral   SpO2: 98% 97% 100% 98%  Weight:      Height:        Intake/Output Summary (Last 24 hours) at 07/21/2021 1345 Last data filed at 07/21/2021 0910 Gross per 24 hour  Intake 915.91 ml  Output 1300 ml  Net -384.09 ml    Filed Weights   07/19/21 1217 07/19/21 2231  Weight: 99.8 kg 100.2 kg    Examination:  General  exam: Appears in nad Respiratory system: Clear to auscultation. Respiratory effort normal. Cardiovascular system: S1 & S2 heard, RRR. No JVD, murmurs, rubs, gallops or clicks. No pedal edema. Gastrointestinal system: Abdomen is nondistended, soft and nontender. No organomegaly or masses felt. Normal bowel sounds heard. Central nervous system: Alert and oriented. No focal neurological deficits. Extremities: No edema.  No swelling to the right knee.  Decreased range of motion due to pain to right knee. Skin: No rashes, lesions or ulcers Psychiatry: Judgement and insight appear normal. Mood & affect appropriate.     Data Reviewed: I have  personally reviewed following labs and imaging studies  CBC: Recent Labs  Lab 07/19/21 1344 07/20/21 0353 07/21/21 0417  WBC 17.5* 12.6* 10.4  NEUTROABS 15.3*  --   --   HGB 14.2 12.1* 12.1*  HCT 42.1 37.1* 36.3*  MCV 87.5 89.2 88.8  PLT 193 162 142*    Basic Metabolic Panel: Recent Labs  Lab 07/19/21 1344 07/19/21 1938 07/20/21 0353 07/21/21 0417  NA 142 143 143 142  K 4.7 4.0 4.3 4.1  CL 112* 112* 113* 114*  CO2 18* 18* 20* 21*  GLUCOSE 133* 170* 190* 150*  BUN 47* 45* 53* 51*  CREATININE 2.01* 1.87* 1.90* 1.77*  CALCIUM 9.2 8.8* 8.5* 8.6*    GFR: Estimated Creatinine Clearance: 35.5 mL/min (A) (by C-G formula based on SCr of 1.77 mg/dL (H)). Liver Function Tests: Recent Labs  Lab 07/19/21 1344  AST 71*  ALT 29  ALKPHOS 54  BILITOT 1.1  PROT 7.6  ALBUMIN 3.7    No results for input(s): "LIPASE", "AMYLASE" in the last 168 hours. No results for input(s): "AMMONIA" in the last 168 hours. Coagulation Profile: No results for input(s): "INR", "PROTIME" in the last 168 hours. Cardiac Enzymes: Recent Labs  Lab 07/19/21 1344 07/20/21 0353 07/21/21 0417  CKTOTAL 2,076* 1,674* 845*    BNP (last 3 results) No results for input(s): "PROBNP" in the last 8760 hours. HbA1C: Recent Labs    07/19/21 1723  HGBA1C 7.1*    CBG: Recent Labs  Lab 07/20/21 1303 07/20/21 1721 07/20/21 2212 07/21/21 0756 07/21/21 1153  GLUCAP 225* 202* 146* 141* 191*    Lipid Profile: No results for input(s): "CHOL", "HDL", "LDLCALC", "TRIG", "CHOLHDL", "LDLDIRECT" in the last 72 hours. Thyroid Function Tests: No results for input(s): "TSH", "T4TOTAL", "FREET4", "T3FREE", "THYROIDAB" in the last 72 hours. Anemia Panel: No results for input(s): "VITAMINB12", "FOLATE", "FERRITIN", "TIBC", "IRON", "RETICCTPCT" in the last 72 hours. Sepsis Labs: Recent Labs  Lab 07/19/21 1317 07/19/21 1656  LATICACIDVEN 2.1* 1.5     Recent Results (from the past 240 hour(s))  SARS  Coronavirus 2 by RT PCR (hospital order, performed in [facility] hospital lab) *cepheid single result test* Anterior Nasal Swab     Status: None   Collection Time: 07/19/21 12:34 PM   Specimen: Anterior Nasal Swab  Result Value Ref Range Status   SARS Coronavirus 2 by RT PCR NEGATIVE NEGATIVE Final    Comment: (NOTE) SARS-CoV-2 target nucleic acids are NOT DETECTED.  The SARS-CoV-2 RNA is generally detectable in upper and lower respiratory specimens during the acute phase of infection. The lowest concentration of SARS-CoV-2 viral copies this assay can detect is 250 copies / mL. A negative result does not preclude SARS-CoV-2 infection and should not be used as the sole basis for treatment or other patient management decisions.  A negative result may occur with improper specimen collection / handling, submission of specimen other  than nasopharyngeal swab, presence of viral mutation(s) within the areas targeted by this assay, and inadequate number of viral copies (<250 copies / mL). A negative result must be combined with clinical observations, patient history, and epidemiological information.  Fact Sheet for Patients:   RoadLapTop.co.za  Fact Sheet for Healthcare Providers: http://kim-miller.com/  This test is not yet approved or  cleared by the [place] FDA and has been authorized for detection and/or diagnosis of SARS-CoV-2 by FDA under an Emergency Use Authorization (EUA).  This EUA will remain in effect (meaning this test can be used) for the duration of the COVID-19 declaration under Section 564(b)(1) of the Act, 21 U.S.C. section 360bbb-3(b)(1), unless the authorization is terminated or revoked sooner.  Performed at [facility], [address], [place], [place] [address]   Culture, blood (Routine X 2) w Reflex to ID Panel     Status: None (Preliminary result)   Collection Time: 07/19/21  1:14 PM    Specimen: BLOOD  Result Value Ref Range Status   Specimen Description   Final    BLOOD RIGHT ANTECUBITAL Performed at [facility], [address] [place], [place] [address]    Special Requests   Final    BOTTLES DRAWN AEROBIC AND ANAEROBIC Blood Culture results may not be optimal due to an excessive volume of blood received in culture bottles Performed at [facility], [address] [place], [place] [address]    Culture   Final    NO GROWTH 2 DAYS Performed at [facility], [address] [place], [place] [address]    Report Status PENDING  Incomplete  Culture, blood (Routine X 2) w Reflex to ID Panel     Status: None (Preliminary result)   Collection Time: 07/19/21  1:17 PM   Specimen: BLOOD  Result Value Ref Range Status   Specimen Description   Final    BLOOD Blood Culture adequate volume Performed at [facility], [address] [place], [place] [address]    Special Requests   Final    BOTTLES DRAWN AEROBIC AND ANAEROBIC Blood Culture adequate volume Performed at [facility], [address] [place], [place] [address]    Culture   Final    NO GROWTH 2 DAYS Performed at [facility], [address] [place], [place] [address]    Report Status PENDING  Incomplete         Radiology Studies: DG Knee Right Port  Result Date: 07/20/2021 CLINICAL DATA:  Worsening right knee pain.  No known injury. EXAM: PORTABLE RIGHT KNEE - 1-2 VIEW COMPARISON:  None Available. FINDINGS: No evidence of fracture or dislocation. Small knee joint effusion noted. Minimal degenerative spurring is seen involving the patella and tibial spines. Enthesopathic changes are seen involving the patella. Peripheral vascular calcification noted. IMPRESSION: Small knee joint effusion. No acute osseous abnormality. Electronically Signed   By: Danae Orleans M.D.   On: 07/20/2021 11:56   CT Head Wo Contrast  Result  Date: 07/19/2021 CLINICAL DATA:  Mental status change EXAM: CT HEAD WITHOUT CONTRAST TECHNIQUE: Contiguous axial images were obtained from the base of the skull through the vertex without intravenous contrast. RADIATION DOSE REDUCTION: This exam was performed according to the departmental dose-optimization program which includes automated exposure control, adjustment of the mA and/or kV according to patient size and/or use of iterative reconstruction technique. COMPARISON:  None Available. FINDINGS: Brain: No evidence of acute infarction,  hemorrhage, hydrocephalus, extra-axial collection or mass lesion/mass effect. Vascular: Calcified atherosclerotic changes in the intracranial carotids. Skull: Normal. Negative for fracture or focal lesion. Sinuses/Orbits: No acute finding. Other: None. IMPRESSION: No acute intracranial abnormalities.  Mild white matter changes. Electronically Signed   By: Gerome Sam III M.D.   On: 07/19/2021 18:56        Scheduled Meds:  atenolol  100 mg Oral Daily   enoxaparin (LOVENOX) injection  40 mg Subcutaneous Q24H   famotidine  20 mg Oral QHS   feeding supplement  237 mL Oral BID BM   insulin aspart  0-15 Units Subcutaneous TID WC   insulin glargine-yfgn  45 Units Subcutaneous QHS   latanoprost  1 drop Both Eyes QHS   timolol  1 drop Right Eye q morning   Continuous Infusions:  lactated ringers 75 mL/hr at 07/21/21 0759     LOS: 1 day    Time spent: 35 minutes Alwyn Ren, MD 07/21/2021, 1:45 PM

## 2021-07-22 DIAGNOSIS — N179 Acute kidney failure, unspecified: Secondary | ICD-10-CM | POA: Diagnosis not present

## 2021-07-22 LAB — CBC
HCT: 34.8 % — ABNORMAL LOW (ref 39.0–52.0)
Hemoglobin: 11.3 g/dL — ABNORMAL LOW (ref 13.0–17.0)
MCH: 29.3 pg (ref 26.0–34.0)
MCHC: 32.5 g/dL (ref 30.0–36.0)
MCV: 90.2 fL (ref 80.0–100.0)
Platelets: 137 10*3/uL — ABNORMAL LOW (ref 150–400)
RBC: 3.86 MIL/uL — ABNORMAL LOW (ref 4.22–5.81)
RDW: 13.5 % (ref 11.5–15.5)
WBC: 8.2 10*3/uL (ref 4.0–10.5)
nRBC: 0 % (ref 0.0–0.2)

## 2021-07-22 LAB — BASIC METABOLIC PANEL
Anion gap: 5 (ref 5–15)
BUN: 39 mg/dL — ABNORMAL HIGH (ref 8–23)
CO2: 23 mmol/L (ref 22–32)
Calcium: 8.3 mg/dL — ABNORMAL LOW (ref 8.9–10.3)
Chloride: 114 mmol/L — ABNORMAL HIGH (ref 98–111)
Creatinine, Ser: 1.45 mg/dL — ABNORMAL HIGH (ref 0.61–1.24)
GFR, Estimated: 47 mL/min — ABNORMAL LOW (ref >60)
Glucose, Bld: 152 mg/dL — ABNORMAL HIGH (ref 70–99)
Potassium: 4 mmol/L (ref 3.5–5.1)
Sodium: 142 mmol/L (ref 135–145)

## 2021-07-22 LAB — GLUCOSE, CAPILLARY
Glucose-Capillary: 118 mg/dL — ABNORMAL HIGH (ref 70–99)
Glucose-Capillary: 228 mg/dL — ABNORMAL HIGH (ref 70–99)
Glucose-Capillary: 175 mg/dL — ABNORMAL HIGH (ref 70–99)
Glucose-Capillary: 226 mg/dL — ABNORMAL HIGH (ref 70–99)

## 2021-07-22 NOTE — Progress Notes (Addendum)
Physical Therapy Treatment Patient Details Name: Antonio White MRN: [id number] DOB: [birth year] Today's Date: 07/22/2021   History of Present Illness 88year-old male lives alone with history of type 2 diabetes, hypertension, obesity, CKD stage IIIb admitted after a mechanical fall.  He denies loss of consciousness chest pain or headaches.  He recently had shingles affecting his right upper leg and his main complaint today is right knee pain.  Patient was on the floor for unknown duration of time until a family member found him and called EMS.  He was found to be in mild AKI and rhabdo.    PT Comments    Pt agreeable to working with therapy. Mod A +2 for bed mobility. He sat EOB for at least 10 minutes with Min guard A. Attempted sit to stand with RW x 2-pt unable today 2* continued weakness and now reporting pain in L knee > R knee. Used hoyer lift to safely transfer pt to recliner to safely get him OOB. Pt will need ST SNF rehab-at baseline pt was Independent.     Recommendations for follow up therapy are one component of a multi-disciplinary discharge planning process, led by the attending physician.  Recommendations may be updated based on patient status, additional functional criteria and insurance authorization.  Follow Up Recommendations  Skilled nursing-short term rehab (<3 hours/day) Can patient physically be transported by private vehicle: No   Assistance Recommended at Discharge Frequent or constant Supervision/Assistance  Patient can return home with the following Two people to help with walking and/or transfers;Two people to help with bathing/dressing/bathroom;Help with stairs or ramp for entrance;Assistance with cooking/housework;Assist for transportation   Equipment Recommendations  None recommended by PT    Recommendations for Other Services       Precautions / Restrictions Precautions Precautions: Fall Precaution Comments: bil knee pain (now L worse than  R) Restrictions Weight Bearing Restrictions: No     Mobility  Bed Mobility Overal bed mobility: Needs Assistance Bed Mobility: Supine to Sit     Supine to sit: Mod assist, +2 for physical assistance, +2 for safety/equipment, HOB elevated     General bed mobility comments: Mod A +2 for bed mobility. Cues for safety, technique. Assist for trunk, bil LEs, and to scoot towards EOB. Once EOB, Min guard A for sitting balance    Transfers Overall transfer level: Needs assistance   Transfers: Sit to/from Stand Sit to Stand: Total assist, +2 physical assistance, +2 safety/equipment, From elevated surface           General transfer comment: Attempted x 2 to stand on today-pt unable. Pt is now c/o pain in L knee moreso than R knee. High fall risk-unable to safely stand on today. Hoyer lift for bed to chair transfer on today. Transfer via Lift Equipment: Producer, television/film/video [name] (Stroke Patients Only)       Balance Overall balance assessment: Needs assistance Sitting-balance support: Bilateral upper extremity supported, Feet supported Sitting balance-Leahy Scale: Fair     Standing balance support: Bilateral upper extremity supported, During functional activity, Reliant on assistive device for balance Standing balance-Leahy Scale: Zero                              Cognition  Arousal/Alertness: Awake/alert Behavior During Therapy: WFL for tasks assessed/performed Overall Cognitive Status: Within Functional Limits for tasks assessed                                          Exercises      General Comments        Pertinent Vitals/Pain Pain Assessment Pain Assessment: Faces Faces Pain Scale: Hurts little more Pain Location: L knee > R knee Pain Descriptors / Indicators: Grimacing, Guarding Pain Intervention(s): Limited activity within patient's  tolerance, Monitored during session, Repositioned    Home Living                          Prior Function            PT Goals (current goals can now be found in the care plan section) Progress towards PT goals: Progressing toward goals    Frequency    Min 2X/week      PT Plan      Co-evaluation              AM-PAC PT "6 Clicks" Mobility   Outcome Measure  Help needed turning from your back to your side while in a flat bed without using bedrails?: A Lot Help needed moving from lying on your back to sitting on the side of a flat bed without using bedrails?: A Lot Help needed moving to and from a bed to a chair (including a wheelchair)?: Total Help needed standing up from a chair using your arms (e.g., wheelchair or bedside chair)?: Total Help needed to walk in hospital room?: Total Help needed climbing 3-5 steps with a railing? : Total 6 Click Score: 8    End of Session   Activity Tolerance: Patient tolerated treatment well Patient left: in chair;with call bell/phone within reach Nurse Communication: Need for lift equipment (secure chat RN and NT) PT Visit Diagnosis: Muscle weakness (generalized) (M62.81);Pain;Difficulty in walking, not elsewhere classified (R26.2);History of falling (Z91.81) Pain - part of body:  (bil knees)     Time: 1350-1420 PT Time Calculation (min) (ACUTE ONLY): 30 min  Charges:  $Therapeutic Activity: 23-37 mins                         Faye Ramsay, PT Acute Rehabilitation  Office: [phone] Pager: [phone]

## 2021-07-22 NOTE — Progress Notes (Signed)
PROGRESS NOTE    Antonio White  HFW:[id number] DOB: Jan 31, 1932 DOA: 07/19/2021 PCP: Anson Fret, MD   Brief Narrative: 88year-old male lives alone with history of type 2 diabetes, hypertension, obesity, CKD stage IIIb admitted after a mechanical fall.  He denies loss of consciousness chest pain or headaches.  He recently had shingles affecting his right upper leg and his main complaint today is right knee pain.  Patient was on the floor for unknown duration of time until a family member found him and called EMS.  He was found to be in mild AKI and rhabdo.  He was admitted for further management.  Assessment & Plan:   Principal Problem:   AKI (acute kidney injury) (HCC) Active Problems:   Hypertension, essential   CKD stage G3b/A1, GFR 30-44 and albumin creatinine ratio <30 mg/g (HCC)   Obesity   T2DM (type 2 diabetes mellitus) (HCC)   Rhabdomyolysis   Fall at home, initial encounter   Dehydration    #1 AKI on CKD stage IIIb -resolved.  From dehydration decreased p.o. intake ACE inhibitor and being on the floor for prolonged period Of time.  Urine analysis shows ketones.   Improving  dc IV fluids 07/22/2021 His creatinine is 1.45 from 1.77 from 1.9 from 2.01 on admission. Baseline creatinine around 1.7. CPK 845 from 1674 down from 2076 Bladder scan with no urinary retention.  #2 leukocytosis resolved.  White count on admission 17.5 down to 12.6 with hydration.  No signs of active infection noted.  Chest x-ray unremarkable. UA with ketones and protein no signs of infection noted    #3 status post fall at home with no loss of consciousness  CT head unremarkable with no acute changes patient lives alone.  PT consult recommending SNF.  His main complaint today is right knee pain.   Right knee x-ray shows some effusion small knee joint effusion otherwise no acute abnormality.  #4 type 2 diabetes continue insulin and SSI.   Restart glipizide  CBG (last 3)  Recent Labs     07/21/21 2131 07/22/21 0726 07/22/21 1151  GLUCAP 185* 118* 228*    #4 history of essential hypertension continue atenolol   Estimated body mass index is 30.82 kg/m as calculated from the following:   Height as of this encounter: 5\' 11"  (1.803 m).   Weight as of this encounter: 100.2 kg.  DVT prophylaxis: Lovenox  code Status: Full code  family Communication: None at bedside  disposition Plan:  Status is: Patient is medically stable to be discharged to SNF family [facility] Await authorization He lives alone Consultants:  none  Procedures: none Antimicrobials: none  Subjective: Patient resting in bed Denies nausea vomiting diarrhea  Objective: Vitals:   07/22/21 0500 07/22/21 1213 07/22/21 1238 07/22/21 1302  BP: (!) 148/69 (!) 184/65 (!) 198/86 (!) 166/69  Pulse: 72 64 64 66  Resp: 20     Temp: 98.2 F (36.8 C) 98.8 F (37.1 C)    TempSrc: Oral Axillary    SpO2: 97% 99% 99% 100%  Weight:      Height:        Intake/Output Summary (Last 24 hours) at 07/22/2021 1348 Last data filed at 07/22/2021 1200 Gross per 24 hour  Intake 1974 ml  Output 1825 ml  Net 149 ml    Filed Weights   07/19/21 1217 07/19/21 2231  Weight: 99.8 kg 100.2 kg    Examination:  General exam: Appears in nad Respiratory system: Clear to auscultation.  Respiratory effort normal. Cardiovascular system: S1 & S2 heard, RRR. No JVD, murmurs, rubs, gallops or clicks. No pedal edema. Gastrointestinal system: Abdomen is nondistended, soft and nontender. No organomegaly or masses felt. Normal bowel sounds heard. Central nervous system: Alert and oriented. No focal neurological deficits. Extremities: No edema.  No swelling to the right knee.  Decreased range of motion due to pain to right knee. Skin: No rashes, lesions or ulcers Psychiatry: Judgement and insight appear normal. Mood & affect appropriate.     Data Reviewed: I have personally reviewed following labs and imaging  studies  CBC: Recent Labs  Lab 07/19/21 1344 07/20/21 0353 07/21/21 0417 07/22/21 0442  WBC 17.5* 12.6* 10.4 8.2  NEUTROABS 15.3*  --   --   --   HGB 14.2 12.1* 12.1* 11.3*  HCT 42.1 37.1* 36.3* 34.8*  MCV 87.5 89.2 88.8 90.2  PLT 193 162 142* 137*    Basic Metabolic Panel: Recent Labs  Lab 07/19/21 1344 07/19/21 1938 07/20/21 0353 07/21/21 0417 07/22/21 0442  NA 142 143 143 142 142  K 4.7 4.0 4.3 4.1 4.0  CL 112* 112* 113* 114* 114*  CO2 18* 18* 20* 21* 23  GLUCOSE 133* 170* 190* 150* 152*  BUN 47* 45* 53* 51* 39*  CREATININE 2.01* 1.87* 1.90* 1.77* 1.45*  CALCIUM 9.2 8.8* 8.5* 8.6* 8.3*    GFR: Estimated Creatinine Clearance: 43.3 mL/min (A) (by C-G formula based on SCr of 1.45 mg/dL (H)). Liver Function Tests: Recent Labs  Lab 07/19/21 1344  AST 71*  ALT 29  ALKPHOS 54  BILITOT 1.1  PROT 7.6  ALBUMIN 3.7    No results for input(s): "LIPASE", "AMYLASE" in the last 168 hours. No results for input(s): "AMMONIA" in the last 168 hours. Coagulation Profile: No results for input(s): "INR", "PROTIME" in the last 168 hours. Cardiac Enzymes: Recent Labs  Lab 07/19/21 1344 07/20/21 0353 07/21/21 0417  CKTOTAL 2,076* 1,674* 845*    BNP (last 3 results) No results for input(s): "PROBNP" in the last 8760 hours. HbA1C: Recent Labs    07/19/21 1723  HGBA1C 7.1*    CBG: Recent Labs  Lab 07/21/21 1153 07/21/21 1651 07/21/21 2131 07/22/21 0726 07/22/21 1151  GLUCAP 191* 222* 185* 118* 228*    Lipid Profile: No results for input(s): "CHOL", "HDL", "LDLCALC", "TRIG", "CHOLHDL", "LDLDIRECT" in the last 72 hours. Thyroid Function Tests: No results for input(s): "TSH", "T4TOTAL", "FREET4", "T3FREE", "THYROIDAB" in the last 72 hours. Anemia Panel: No results for input(s): "VITAMINB12", "FOLATE", "FERRITIN", "TIBC", "IRON", "RETICCTPCT" in the last 72 hours. Sepsis Labs: Recent Labs  Lab 07/19/21 1317 07/19/21 1656  LATICACIDVEN 2.1* 1.5      Recent Results (from the past 240 hour(s))  SARS Coronavirus 2 by RT PCR (hospital order, performed in [facility] hospital lab) *cepheid single result test* Anterior Nasal Swab     Status: None   Collection Time: 07/19/21 12:34 PM   Specimen: Anterior Nasal Swab  Result Value Ref Range Status   SARS Coronavirus 2 by RT PCR NEGATIVE NEGATIVE Final    Comment: (NOTE) SARS-CoV-2 target nucleic acids are NOT DETECTED.  The SARS-CoV-2 RNA is generally detectable in upper and lower respiratory specimens during the acute phase of infection. The lowest concentration of SARS-CoV-2 viral copies this assay can detect is 250 copies / mL. A negative result does not preclude SARS-CoV-2 infection and should not be used as the sole basis for treatment or other patient management decisions.  A negative result may  occur with improper specimen collection / handling, submission of specimen other than nasopharyngeal swab, presence of viral mutation(s) within the areas targeted by this assay, and inadequate number of viral copies (<250 copies / mL). A negative result must be combined with clinical observations, patient history, and epidemiological information.  Fact Sheet for Patients:   https://www.patel.info/  Fact Sheet for Healthcare Providers: https://hall.com/  This test is not yet approved or  cleared by the [place] FDA and has been authorized for detection and/or diagnosis of SARS-CoV-2 by FDA under an Emergency Use Authorization (EUA).  This EUA will remain in effect (meaning this test can be used) for the duration of the COVID-19 declaration under Section 564(b)(1) of the Act, 21 U.S.C. section 360bbb-3(b)(1), unless the authorization is terminated or revoked sooner.  Performed at [facility], [place] [address], [place], [place] [address]   Culture, blood (Routine X 2) w Reflex to ID Panel     Status: None  (Preliminary result)   Collection Time: 07/19/21  1:14 PM   Specimen: BLOOD  Result Value Ref Range Status   Specimen Description   Final    BLOOD RIGHT ANTECUBITAL Performed at [facility] [address] [place], [place] [address]    Special Requests   Final    BOTTLES DRAWN AEROBIC AND ANAEROBIC Blood Culture results may not be optimal due to an excessive volume of blood received in culture bottles Performed at [facility] [address], [place], [place] [address]    Culture   Final    NO GROWTH 2 DAYS Performed at [facility] [address] [place], [place] [address]    Report Status PENDING  Incomplete  Culture, blood (Routine X 2) w Reflex to ID Panel     Status: None (Preliminary result)   Collection Time: 07/19/21  1:17 PM   Specimen: BLOOD  Result Value Ref Range Status   Specimen Description   Final    BLOOD Blood Culture adequate volume Performed at [facility] [address], [place], [place] [address]    Special Requests   Final    BOTTLES DRAWN AEROBIC AND ANAEROBIC Blood Culture adequate volume Performed at [facility] [address], [place], [place] [address]    Culture   Final    NO GROWTH 2 DAYS Performed at [facility] [address], [place], [place] [address]    Report Status PENDING  Incomplete         Radiology Studies: No results found.      Scheduled Meds:  atenolol  100 mg Oral Daily   enoxaparin (LOVENOX) injection  40 mg Subcutaneous Q24H   famotidine  20 mg Oral QHS   feeding supplement  237 mL Oral BID BM   insulin aspart  0-15 Units Subcutaneous TID WC   insulin glargine-yfgn  45 Units Subcutaneous QHS   latanoprost  1 drop Both Eyes QHS   timolol  1 drop Right Eye q morning   Continuous Infusions:     LOS: 2 days    Time spent: 35 minutes Georgette Shell, MD 07/22/2021, 1:48 PM

## 2021-07-22 NOTE — Consult Note (Signed)
WOC Nurse Consult Note: Reason for Consult:skin tear on mid-thoracic area, right  Wound type:partial thickness, trauma Pressure Injury POA: N/A Measurement:3cm x 2cm x 0.1cm Wound bed:red Drainage (amount, consistency, odor) small serous Periwound: erythematous Dressing procedure/placement/frequency: I have provided guidance for nursing for the use of an antimicrobial nonadherent dressing (xeroform) topped with a silicone foam dressing for atraumatic removal.  Bilateral heels are to be floated, turning and repositioning is in place and should minimize time in the supine position and and a sacral silicone foam will be placed to the sacrum to prevent PI.  WOC nursing team will not follow, but will remain available to this patient, the nursing and medical teams.  Please re-consult if needed.  Thank you for inviting [place] to participate in this patient's Plan of Care.  Ladona Mow, MSN, RN, CNS, GNP, Leda Min, [employer], [employer] phone:  [phone]

## 2021-07-22 NOTE — TOC Progression Note (Signed)
Transition of Care [facility]) - Progression Note    Patient Details  Name: Antonio White MRN: [id number] Date of Birth: 1932-11-27  [facility]) CM/SW Contact  Armanda Heritage, RN Phone Number: 07/22/2021, 12:36 PM  Clinical Narrative:    CM spoke with patient and presented bed offers, patient's first choice [facility] has no open beds at this time, with no anticipated discharges.  Patient now selects [facility].  Cm contacted facility and left message for admissions rep, insurance auth initiated.    Expected Discharge Plan: Skilled Nursing Facility Barriers to Discharge: Insurance Authorization  Expected Discharge Plan and Services Expected Discharge Plan: Skilled Nursing Facility     Post Acute Care Choice: Skilled Nursing Facility Living arrangements for the past 2 months: Single Family Home                                       Social Determinants of Health (SDOH) Interventions    Readmission Risk Interventions     No data to display

## 2021-07-23 DIAGNOSIS — N179 Acute kidney failure, unspecified: Secondary | ICD-10-CM | POA: Diagnosis not present

## 2021-07-23 LAB — GLUCOSE, CAPILLARY
Glucose-Capillary: 30 mg/dL — CL (ref 70–99)
Glucose-Capillary: 36 mg/dL — CL (ref 70–99)
Glucose-Capillary: 133 mg/dL — ABNORMAL HIGH (ref 70–99)
Glucose-Capillary: 64 mg/dL — ABNORMAL LOW (ref 70–99)
Glucose-Capillary: 178 mg/dL — ABNORMAL HIGH (ref 70–99)

## 2021-07-23 MED ORDER — ENSURE ENLIVE PO LIQD: 237.0000 mL | Freq: Two times a day (BID) | ORAL | 12 refills | Status: DC

## 2021-07-23 MED ORDER — AMLODIPINE BESYLATE 10 MG PO TABS: 10.0000 mg | ORAL_TABLET | Freq: Every day | ORAL | 11 refills | Status: DC

## 2021-07-23 MED ORDER — INSULIN GLARGINE-YFGN 100 UNIT/ML ~~LOC~~ SOLN: 35.0000 [IU] | Freq: Every day | SUBCUTANEOUS | 11 refills | Status: DC

## 2021-07-23 MED ORDER — INSULIN GLARGINE-YFGN 100 UNIT/ML ~~LOC~~ SOLN
35.0000 [IU] | Freq: Every day | SUBCUTANEOUS | Status: DC
  Filled 2021-07-23: qty 0.35

## 2021-07-23 NOTE — Progress Notes (Signed)
Occupational Therapy Treatment Patient Details Name: Antonio White MRN: [id number] DOB: [birth year] Today's Date: 07/23/2021   History of present illness 88year-old male lives alone with history of type 2 diabetes, hypertension, obesity, CKD stage IIIb admitted after a mechanical fall.  He denies loss of consciousness chest pain or headaches.  He recently had shingles affecting his right upper leg and his main complaint today is right knee pain.  Patient was on the floor for unknown duration of time until a family member found him and called EMS.  He was found to be in mild AKI and rhabdo.   OT comments  Treatment focused on LE exercises/ROM to prepare for standing as patient reports bilateral knee pain. He also reports new onset right wrist pain. Unable to use right hand to assist with transfer into sitting or standing but could bear down through right forearm. Potentially IV site causing pain or maybe gait as he now reports left knee hurting more than right. He was able to stand from elevated bed with use of stedy and min x 2 and then twice more within stedy. Patient able to stand x 30 seconds and then one minute. Will require hoyer to stand from recliner height.    Recommendations for follow up therapy are one component of a multi-disciplinary discharge planning process, led by the attending physician.  Recommendations may be updated based on patient status, additional functional criteria and insurance authorization.    Follow Up Recommendations  Skilled nursing-short term rehab (<3 hours/day)    Assistance Recommended at Discharge Frequent or constant Supervision/Assistance  Patient can return home with the following  Two people to help with walking and/or transfers;A lot of help with bathing/dressing/bathroom;Direct supervision/assist for financial management;Assist for transportation;Help with stairs or ramp for entrance;Direct supervision/assist for medications management   Equipment  Recommendations  None recommended by OT    Recommendations for Other Services      Precautions / Restrictions Precautions Precautions: Fall Precaution Comments: bil knee pain (now L worse than R) Restrictions Weight Bearing Restrictions: No       Mobility Bed Mobility Overal bed mobility: Needs Assistance Bed Mobility: Supine to Sit     Supine to sit: Mod assist, +2 for safety/equipment, HOB elevated     General bed mobility comments: Mod assist today to transfer to edge of bed - mostly for trun negotiation. Limited by R wrist pain and not able to use right hand to manuever himself in bed.    Transfers Overall transfer level: Needs assistance Equipment used: None Transfers: Sit to/from Stand, Bed to chair/wheelchair/BSC Sit to Stand: Min assist, +2 physical assistance, From elevated surface Stand pivot transfers: Total assist         General transfer comment: Stood x 3 from high perch position - once from bed and twice in stedy. Transfer via Lift Equipment: Stedy   Balance Overall balance assessment: Needs assistance Sitting-balance support: No upper extremity supported, Feet supported Sitting balance-Leahy Scale: Fair     Standing balance support: Bilateral upper extremity supported, During functional activity, Reliant on assistive device for balance Standing balance-Leahy Scale: Poor                             ADL either performed or assessed with clinical judgement   ADL  Extremity/Trunk Assessment              Vision Patient Visual Report: No change from baseline     Perception     Praxis      Cognition Arousal/Alertness: Awake/alert Behavior During Therapy: WFL for tasks assessed/performed Overall Cognitive Status: Within Functional Limits for tasks assessed                                          Exercises Other Exercises Other Exercises: Hip  flexion x 5 RLE, knee extension x 5 each leg on foam roll, assessment of right wrist to see if patient tolerated active/PROM ROM    Shoulder Instructions       General Comments      Pertinent Vitals/ Pain       Pain Assessment Pain Assessment: Faces Faces Pain Scale: Hurts whole lot Pain Location: R wrist, L knee Pain Descriptors / Indicators: Grimacing, Guarding Pain Intervention(s): Monitored during session, Limited activity within patient's tolerance  Home Living                                          Prior Functioning/Environment              Frequency  Min 2X/week        Progress Toward Goals  OT Goals(current goals can now be found in the care plan section)     Acute Rehab OT Goals Patient Stated Goal: to walk to bathroom OT Goal Formulation: With patient Time For Goal Achievement: 08/03/21 Potential to Achieve Goals: Good  Plan      Co-evaluation          OT goals addressed during session:  (functional mobility)      AM-PAC OT "6 Clicks" Daily Activity     Outcome Measure   Help from another person eating meals?: None Help from another person taking care of personal grooming?: A Little Help from another person toileting, which includes using toliet, bedpan, or urinal?: Total Help from another person bathing (including washing, rinsing, drying)?: A Lot Help from another person to put on and taking off regular upper body clothing?: A Little Help from another person to put on and taking off regular lower body clothing?: Total 6 Click Score: 14    End of Session Equipment Utilized During Treatment: Gait belt (stedy)  OT Visit Diagnosis: Muscle weakness (generalized) (M62.81);Pain;Other abnormalities of gait and mobility (R26.89) Pain - Right/Left: Right Pain - part of body: Hand   Activity Tolerance Patient tolerated treatment well   Patient Left in bed;with call bell/phone within reach;with bed alarm set   Nurse  Communication Mobility status        Time: 7591-6384 OT Time Calculation (min): 25 min  Charges: OT General Charges $OT Visit: 1 Visit OT Treatments $Therapeutic Activity: 8-22 mins $Therapeutic Exercise: 8-22 mins  Ashton Sabine, OTR/L Acute Care Rehab Services  Office [phone] Pager: [phone]   Kelli Churn 07/23/2021, 1:08 PM

## 2021-07-23 NOTE — TOC Transition Note (Signed)
Transition of Care [facility]) - CM/SW Discharge Note   Patient Details  Name: Antonio White MRN: [id number] Date of Birth: 05-16-1932  Transition of Care (TOC) CM/SW Contact:  Armanda Heritage, RN Phone Number: 07/23/2021, 12:43 PM   Clinical Narrative:    Patient is approved for SNF stay,approved 7/18 - 7/20, navi auth  id [id number], plan auth id [id number].  M confirmed bed is available today.  Patient to [facility] room 313B, report number [phone].  PTAR transport arranged, discharge summary sent to facility via HUB.     Final next level of care: Skilled Nursing Facility Barriers to Discharge: No Barriers Identified   Patient Goals and CMS Choice Patient states their goals for this hospitalization and ongoing recovery are:: To go to SNF for rehab then return back home. CMS Medicare.gov Compare Post Acute Care list provided to:: Patient Choice offered to / list presented to : Patient  Discharge Placement                       Discharge Plan and Services     Post Acute Care Choice: Skilled Nursing Facility                               Social Determinants of Health (SDOH) Interventions     Readmission Risk Interventions     No data to display

## 2021-07-23 NOTE — Discharge Summary (Signed)
Physician Discharge Summary   Patient: Antonio White MRN: [id number] DOB: Sep 08, 1932  Admit date:     07/19/2021  Discharge date: 07/23/21  Discharge Physician: Fran Lowes   PCP: Anson Fret, MD   Recommendations at discharge:    Discharge to SNF for rehab prior to discharge to home Follow up with PCP in 7-10 days after discharge from facility. CBC and BMP should be drawn in one week and reported to facility physician.  Discharge Diagnoses: Principal Problem:   AKI (acute kidney injury) (HCC) Active Problems:   Hypertension, essential   CKD stage G3b/A1, GFR 30-44 and albumin creatinine ratio <30 mg/g (HCC)   Obesity   T2DM (type 2 diabetes mellitus) (HCC)   Rhabdomyolysis   Fall at home, initial encounter   Dehydration  Resolved Problems:   * No resolved hospital problems. *  Hospital Course: 88year-old male lives alone with history of type 2 diabetes, hypertension, obesity, CKD stage IIIb admitted after a mechanical fall.  He denies loss of consciousness chest pain or headaches.  He recently had shingles affecting his right upper leg and his main complaint today is right knee pain.  Patient was on the floor for unknown duration of time until a family member found him and called EMS.  He was found to be in mild AKI and rhabdo.  He was admitted for further management.  The patient was admitted to a telemetry bed. He has been given IV fluids. His Rhabdomyolysis and AKI have resolved. He was continued on insulin and SSI for control of his glucoses. He complained of pain in his right knee. Imagery confirmed the presence of an effusion which is resolving, although his knee continued to be painful. As the patient feel at home, leading to this admission, he has been evaluated by PT/OT. They have determined that he would benefit from PT/OT in a SNF prior to returning home. He will discharge to a SNF today.  Assessment and Plan: No notes have been filed under this hospital  service. Service: Hospitalist  Consultants:  Wound Care    Procedures performed: None   Disposition: Skilled nursing facility Diet recommendation:  Discharge Diet Orders (From admission, onward)     Start     Ordered   07/23/21 0000  Diet - low sodium heart healthy        07/23/21 1220           Cardiac and Carb modified diet DISCHARGE MEDICATION: Allergies as of 07/23/2021       Reactions   Metformin And Related Hives   Penicillins Hives   Pneumococcal Vaccine Swelling, Other (See Comments)   Swelling of upper limb - reported by [facility]        Medication List     STOP taking these medications    glipiZIDE 10 MG tablet Commonly known as: GLUCOTROL   lisinopril 20 MG tablet Commonly known as: ZESTRIL       TAKE these medications    Alogliptin Benzoate 25 MG Tabs Take 25 mg by mouth every morning.   amLODipine 10 MG tablet Commonly known as: NORVASC Take 1 tablet (10 mg total) by mouth at bedtime.   aspirin EC 81 MG tablet Take 81 mg by mouth daily. Swallow whole.   atenolol 100 MG tablet Commonly known as: TENORMIN Take 100 mg by mouth daily.   Centrum Silver 50+Men Tabs Take 1 tablet by mouth daily with breakfast.   famotidine 20 MG tablet Commonly known as: PEPCID Take 20 mg  by mouth at bedtime.   feeding supplement Liqd Take 237 mLs by mouth 2 (two) times daily between meals.   insulin glargine-yfgn 100 UNIT/ML injection Commonly known as: SEMGLEE Inject 0.35 mLs (35 Units total) into the skin at bedtime. What changed:  how much to take when to take this additional instructions   latanoprost 0.005 % ophthalmic solution Commonly known as: XALATAN Place 1 drop into both eyes at bedtime.   timolol 0.5 % ophthalmic gel-forming Commonly known as: TIMOPTIC-XR Place 1 drop into the right eye every morning.               Discharge Care Instructions  (From admission, onward)           Start     Ordered   07/23/21 0000   Discharge wound care:       Comments: Wound care to right back, mid-thoracic area:  Cleanse with NS, pat dry. Cover with xeroform gauze, top with silicone foam dressing. Change xeroform daily, May reuse the silicone foam for up to 3 days, Change PRN soiling.   07/23/21 1220            Contact information for after-discharge care     Destination     [facility] .   Service: Skilled Nursing Contact information: [address] [place] [address] [phone]                    Discharge Exam: Ceasar Mons Weights   07/19/21 1217 07/19/21 2231  Weight: 99.8 kg 100.2 kg   Exam:  Constitutional:  The patient is awake, alert, and oriented x 3. No acute distress. Eyes:  pupils and irises appear normal Normal lids and conjunctivae ENMT:  grossly normal hearing  Lips appear normal external ears, nose appear normal Oropharynx: mucosa, tongue,posterior pharynx appear normal Neck:  neck appears normal, no masses, normal ROM, supple no thyromegaly Respiratory:  No increased work of breathing. No wheezes, rales, or rhonchi No tactile fremitus Cardiovascular:  Regular rate and rhythm No murmurs, ectopy, or gallups. No lateral PMI. No thrills. Abdomen:  Abdomen is soft, non-tender, non-distended No hernias, masses, or organomegaly Normoactive bowel sounds.  Musculoskeletal:  No cyanosis, clubbing, or edema Skin:  No rashes, lesions, ulcers palpation of skin: no induration or nodules Neurologic:  CN 2-12 intact Sensation all 4 extremities intact Psychiatric:  Mental status Mood, affect appropriate Orientation to person, place, time  judgment and insight appear intact   Condition at discharge: fair  The results of significant diagnostics from this hospitalization (including imaging, microbiology, ancillary and laboratory) are listed below for reference.   Imaging Studies: DG Knee Right Port  Result Date:  07/20/2021 CLINICAL DATA:  Worsening right knee pain.  No known injury. EXAM: PORTABLE RIGHT KNEE - 1-2 VIEW COMPARISON:  None Available. FINDINGS: No evidence of fracture or dislocation. Small knee joint effusion noted. Minimal degenerative spurring is seen involving the patella and tibial spines. Enthesopathic changes are seen involving the patella. Peripheral vascular calcification noted. IMPRESSION: Small knee joint effusion. No acute osseous abnormality. Electronically Signed   By: Danae Orleans M.D.   On: 07/20/2021 11:56   CT Head Wo Contrast  Result Date: 07/19/2021 CLINICAL DATA:  Mental status change EXAM: CT HEAD WITHOUT CONTRAST TECHNIQUE: Contiguous axial images were obtained from the base of the skull through the vertex without intravenous contrast. RADIATION DOSE REDUCTION: This exam was performed according to the departmental dose-optimization program which includes  automated exposure control, adjustment of the mA and/or kV according to patient size and/or use of iterative reconstruction technique. COMPARISON:  None Available. FINDINGS: Brain: No evidence of acute infarction, hemorrhage, hydrocephalus, extra-axial collection or mass lesion/mass effect. Vascular: Calcified atherosclerotic changes in the intracranial carotids. Skull: Normal. Negative for fracture or focal lesion. Sinuses/Orbits: No acute finding. Other: None. IMPRESSION: No acute intracranial abnormalities.  Mild white matter changes. Electronically Signed   By: Gerome Sam III M.D.   On: 07/19/2021 18:56   DG Chest Port 1 View  Result Date: 07/19/2021 CLINICAL DATA:  Shortness of breath EXAM: PORTABLE CHEST 1 VIEW COMPARISON:  None Available. FINDINGS: The heart size and mediastinal contours are within normal limits. Both lungs are clear. The visualized skeletal structures are unremarkable. IMPRESSION: No active disease. Electronically Signed   By: Duanne Guess D.O.   On: 07/19/2021 14:00    Microbiology: Results for  orders placed or performed during the hospital encounter of 07/19/21  SARS Coronavirus 2 by RT PCR (hospital order, performed in [facility] hospital lab) *cepheid single result test* Anterior Nasal Swab     Status: None   Collection Time: 07/19/21 12:34 PM   Specimen: Anterior Nasal Swab  Result Value Ref Range Status   SARS Coronavirus 2 by RT PCR NEGATIVE NEGATIVE Final    Comment: (NOTE) SARS-CoV-2 target nucleic acids are NOT DETECTED.  The SARS-CoV-2 RNA is generally detectable in upper and lower respiratory specimens during the acute phase of infection. The lowest concentration of SARS-CoV-2 viral copies this assay can detect is 250 copies / mL. A negative result does not preclude SARS-CoV-2 infection and should not be used as the sole basis for treatment or other patient management decisions.  A negative result may occur with improper specimen collection / handling, submission of specimen other than nasopharyngeal swab, presence of viral mutation(s) within the areas targeted by this assay, and inadequate number of viral copies (<250 copies / mL). A negative result must be combined with clinical observations, patient history, and epidemiological information.  Fact Sheet for Patients:   RoadLapTop.co.za  Fact Sheet for Healthcare Providers: http://kim-miller.com/  This test is not yet approved or  cleared by the [place] FDA and has been authorized for detection and/or diagnosis of SARS-CoV-2 by FDA under an Emergency Use Authorization (EUA).  This EUA will remain in effect (meaning this test can be used) for the duration of the COVID-19 declaration under Section 564(b)(1) of the Act, 21 U.S.C. section 360bbb-3(b)(1), unless the authorization is terminated or revoked sooner.  Performed at [facility], [address], [place], [place] [address]   Culture, blood (Routine X 2) w Reflex to ID Panel      Status: None (Preliminary result)   Collection Time: 07/19/21  1:14 PM   Specimen: BLOOD  Result Value Ref Range Status   Specimen Description   Final    BLOOD RIGHT ANTECUBITAL Performed at [facility], [address], [place], [place] [address]    Special Requests   Final    BOTTLES DRAWN AEROBIC AND ANAEROBIC Blood Culture results may not be optimal due to an excessive volume of blood received in culture bottles Performed at [facility], [address], [place], [place] [address]    Culture   Final    NO GROWTH 4 DAYS Performed at [facility], [address] [place], [place] [address]    Report Status PENDING  Incomplete  Culture, blood (  Routine X 2) w Reflex to ID Panel     Status: None (Preliminary result)   Collection Time: 07/19/21  1:17 PM   Specimen: BLOOD  Result Value Ref Range Status   Specimen Description   Final    BLOOD Blood Culture adequate volume Performed at [facility], [address], [place], [place] [address]    Special Requests   Final    BOTTLES DRAWN AEROBIC AND ANAEROBIC Blood Culture adequate volume Performed at [facility], [address], [place], [place] [address]    Culture   Final    NO GROWTH 4 DAYS Performed at [facility], [address], [place], [place] [address]    Report Status PENDING  Incomplete    Labs: CBC: Recent Labs  Lab 07/19/21 1344 07/20/21 0353 07/21/21 0417 07/22/21 0442  WBC 17.5* 12.6* 10.4 8.2  NEUTROABS 15.3*  --   --   --   HGB 14.2 12.1* 12.1* 11.3*  HCT 42.1 37.1* 36.3* 34.8*  MCV 87.5 89.2 88.8 90.2  PLT 193 162 142* 137*   Basic Metabolic Panel: Recent Labs  Lab 07/19/21 1344 07/19/21 1938 07/20/21 0353 07/21/21 0417 07/22/21 0442  NA 142 143 143 142 142  K 4.7 4.0 4.3 4.1 4.0  CL 112* 112* 113* 114* 114*  CO2 18* 18* 20* 21* 23  GLUCOSE 133* 170* 190* 150* 152*  BUN 47* 45* 53* 51* 39*  CREATININE  2.01* 1.87* 1.90* 1.77* 1.45*  CALCIUM 9.2 8.8* 8.5* 8.6* 8.3*   Liver Function Tests: Recent Labs  Lab 07/19/21 1344  AST 71*  ALT 29  ALKPHOS 54  BILITOT 1.1  PROT 7.6  ALBUMIN 3.7   CBG: Recent Labs  Lab 07/23/21 0725 07/23/21 0727 07/23/21 0742 07/23/21 0813 07/23/21 1158  GLUCAP 30* 36* 64* 133* 178*    Discharge time spent: greater than 30 minutes.  Signed: Jacek Colson, DO [facility] 07/23/2021

## 2021-07-23 NOTE — Plan of Care (Signed)

## 2021-07-24 LAB — CULTURE, BLOOD (ROUTINE X 2)
Culture: NO GROWTH
Culture: NO GROWTH
Special Requests: ADEQUATE
Specimen Description: ADEQUATE

## 2021-08-23 ENCOUNTER — Emergency Department (HOSPITAL_COMMUNITY): Payer: No Typology Code available for payment source

## 2021-08-23 ENCOUNTER — Emergency Department (HOSPITAL_COMMUNITY)
Admission: EM | Admit: 2021-08-23 | Discharge: 2021-08-23 | Disposition: A | Discharge status: Home / Self Care | Payer: No Typology Code available for payment source | Attending: Emergency Medicine | Admitting: Emergency Medicine

## 2021-08-23 DIAGNOSIS — Z7982 Long term (current) use of aspirin: Secondary | ICD-10-CM | POA: Diagnosis not present

## 2021-08-23 DIAGNOSIS — Z794 Long term (current) use of insulin: Secondary | ICD-10-CM | POA: Diagnosis not present

## 2021-08-23 DIAGNOSIS — R55 Syncope and collapse: Secondary | ICD-10-CM | POA: Insufficient documentation

## 2021-08-23 DIAGNOSIS — E162 Hypoglycemia, unspecified: Secondary | ICD-10-CM

## 2021-08-23 DIAGNOSIS — E11649 Type 2 diabetes mellitus with hypoglycemia without coma: Secondary | ICD-10-CM | POA: Diagnosis not present

## 2021-08-23 DIAGNOSIS — Z79899 Other long term (current) drug therapy: Secondary | ICD-10-CM | POA: Insufficient documentation

## 2021-08-23 LAB — URINALYSIS, ROUTINE W REFLEX MICROSCOPIC
Bacteria, UA: NONE SEEN
Bilirubin Urine: NEGATIVE
Glucose, UA: NEGATIVE mg/dL
Ketones, ur: NEGATIVE mg/dL
Leukocytes,Ua: NEGATIVE
Nitrite: NEGATIVE
Protein, ur: >=300 mg/dL — AB
Specific Gravity, Urine: 1.015 (ref 1.005–1.030)
pH: 5 (ref 5.0–8.0)

## 2021-08-23 LAB — CBC WITH DIFFERENTIAL/PLATELET
Abs Immature Granulocytes: 0.02 10*3/uL (ref 0.00–0.07)
Basophils Absolute: 0 10*3/uL (ref 0.0–0.1)
Basophils Relative: 0 %
Eosinophils Absolute: 0 10*3/uL (ref 0.0–0.5)
Eosinophils Relative: 0 %
HCT: 33.8 % — ABNORMAL LOW (ref 39.0–52.0)
Hemoglobin: 11.1 g/dL — ABNORMAL LOW (ref 13.0–17.0)
Immature Granulocytes: 0 %
Lymphocytes Relative: 11 %
Lymphs Abs: 1.3 10*3/uL (ref 0.7–4.0)
MCH: 29.2 pg (ref 26.0–34.0)
MCHC: 32.8 g/dL (ref 30.0–36.0)
MCV: 88.9 fL (ref 80.0–100.0)
Monocytes Absolute: 1 10*3/uL (ref 0.1–1.0)
Monocytes Relative: 9 %
Neutro Abs: 8.9 10*3/uL — ABNORMAL HIGH (ref 1.7–7.7)
Neutrophils Relative %: 80 %
Platelets: 168 10*3/uL (ref 150–400)
RBC: 3.8 MIL/uL — ABNORMAL LOW (ref 4.22–5.81)
RDW: 13.3 % (ref 11.5–15.5)
WBC: 11.2 10*3/uL — ABNORMAL HIGH (ref 4.0–10.5)
nRBC: 0 % (ref 0.0–0.2)

## 2021-08-23 LAB — CBG MONITORING, ED
Glucose-Capillary: 88 mg/dL (ref 70–99)
Glucose-Capillary: 68 mg/dL — ABNORMAL LOW (ref 70–99)
Glucose-Capillary: 102 mg/dL — ABNORMAL HIGH (ref 70–99)
Glucose-Capillary: 91 mg/dL (ref 70–99)
Glucose-Capillary: 184 mg/dL — ABNORMAL HIGH (ref 70–99)

## 2021-08-23 LAB — BASIC METABOLIC PANEL
Anion gap: 9 (ref 5–15)
BUN: 36 mg/dL — ABNORMAL HIGH (ref 8–23)
CO2: 26 mmol/L (ref 22–32)
Calcium: 9 mg/dL (ref 8.9–10.3)
Chloride: 106 mmol/L (ref 98–111)
Creatinine, Ser: 1.72 mg/dL — ABNORMAL HIGH (ref 0.61–1.24)
GFR, Estimated: 38 mL/min — ABNORMAL LOW (ref >60)
Glucose, Bld: 77 mg/dL (ref 70–99)
Potassium: 4.1 mmol/L (ref 3.5–5.1)
Sodium: 141 mmol/L (ref 135–145)

## 2021-08-23 NOTE — ED Triage Notes (Signed)
Ems brings pt in from home for an unwitnessed fall. Pt's initial blood sugar was 40 but came up after eating fruit and ems gave oral glucose. Pt doesn't remember the fall. Pt confused upon ems arrival.

## 2021-08-23 NOTE — ED Provider Notes (Signed)
[facility]EM[facility]GENCY DEPT Provider Note   CSN: [id number] Arrival date & time: 08/23/21  1227     History  Chief Complaint  Patient presents with   Antonio White. is a 88 y.o. male.  88 year old male who presents after syncopal event just prior to arrival.  Patient's blood sugar was 40.  Patient states that he has diabetes and his blood sugar normally runs low in the mornings.  Denies any recent illnesses.  States his been compliant with his diabetic medications.  Was given fruit and oral glucose and blood sugar improved.  Blood sugar on arrival here is 88.  Denies any headache or neck discomfort.  No back chest or abdominal discomfort.  Denies any focal weakness       Home Medications Prior to Admission medications   Medication Sig Start Date End Date Taking? Authorizing Provider  Alogliptin Benzoate 25 MG TABS Take 25 mg by mouth every morning.    [provider]  amLODipine (NORVASC) 10 MG tablet Take 1 tablet (10 mg total) by mouth at bedtime. 07/23/21 07/23/22  Swayze, Ava, DO  aspirin EC 81 MG tablet Take 81 mg by mouth daily. Swallow whole.    [provider]  atenolol (TENORMIN) 100 MG tablet Take 100 mg by mouth daily.    [provider]  famotidine (PEPCID) 20 MG tablet Take 20 mg by mouth at bedtime.    [provider]  feeding supplement (ENSURE ENLIVE / ENSURE PLUS) LIQD Take 237 mLs by mouth 2 (two) times daily between meals. 07/23/21   Swayze, Ava, DO  insulin glargine-yfgn (SEMGLEE) 100 UNIT/ML injection Inject 0.35 mLs (35 Units total) into the skin at bedtime. 07/23/21   Swayze, Ava, DO  latanoprost (XALATAN) 0.005 % ophthalmic solution Place 1 drop into both eyes at bedtime.    [provider]  Multiple Vitamins-Minerals (CENTRUM SILVER 50+MEN) TABS Take 1 tablet by mouth daily with breakfast.    [provider]  timolol (TIMOPTIC-XR) 0.5 % ophthalmic gel-forming Place 1 drop into  the right eye every morning.    [provider]      Allergies    Metformin and related, Penicillins, and Pneumococcal vaccine    Review of Systems   Review of Systems  All other systems reviewed and are negative.   Physical Exam Updated Vital Signs BP (!) 153/79   Pulse 70   Temp 97.8 F (36.6 C) (Oral)   Resp 20   Ht 1.803 m (5\' 11" )   Wt 121.1 kg   SpO2 100%   BMI 37.24 kg/m  Physical Exam Vitals and nursing note reviewed.  Constitutional:      General: He is not in acute distress.    Appearance: Normal appearance. He is well-developed. He is not toxic-appearing.  HENT:     Head: Normocephalic and atraumatic.  Eyes:     General: Lids are normal.     Conjunctiva/sclera: Conjunctivae normal.     Pupils: Pupils are equal, round, and reactive to light.  Neck:     Thyroid: No thyroid mass.     Trachea: No tracheal deviation.  Cardiovascular:     Rate and Rhythm: Normal rate and regular rhythm.     Heart sounds: Normal heart sounds. No murmur heard.    No gallop.  Pulmonary:     Effort: Pulmonary effort is normal. No respiratory distress.     Breath sounds: Normal breath sounds. No stridor. No  decreased breath sounds, wheezing, rhonchi or rales.  Abdominal:     General: There is no distension.     Palpations: Abdomen is soft.     Tenderness: There is no abdominal tenderness. There is no rebound.  Musculoskeletal:        General: No tenderness. Normal range of motion.     Cervical back: Normal range of motion and neck supple.  Skin:    General: Skin is warm and dry.     Findings: No abrasion or rash.  Neurological:     Mental Status: He is alert and oriented to person, place, and time. Mental status is at baseline.     GCS: GCS eye subscore is 4. GCS verbal subscore is 5. GCS motor subscore is 6.     Cranial Nerves: No cranial nerve deficit.     Sensory: No sensory deficit.     Motor: Motor function is intact.  Psychiatric:        Attention and  Perception: Attention normal.        Speech: Speech normal.        Behavior: Behavior normal.     ED Results / Procedures / Treatments   Labs (all labs ordered are listed, but only abnormal results are displayed) Labs Reviewed  CBG MONITORING, ED    EKG None  Radiology No results found.  Procedures Procedures    Medications Ordered in ED Medications - No data to display  ED Course/ Medical Decision Making/ A&P                           Medical Decision Making Amount and/or Complexity of Data Reviewed Labs: ordered. Radiology: ordered. ECG/medicine tests: ordered.   Patient's blood sugar remained labile here but did improve eventually with repeated oral nutrition.  Urinalysis negative for infection here.  Head CT per my interpretation showed no acute findings.  Patient is alert and oriented.  Patient ambulates with a cane and a walker at baseline.  States he feels he is ready go home.  Will ambulate patient here in the department with a walker and if able we will send home.  CRITICAL CARE Performed by: Toy Baker Total critical care time: 50 minutes Critical care time was exclusive of separately billable procedures and treating other patients. Critical care was necessary to treat or prevent imminent or life-threatening deterioration. Critical care was time spent personally by me on the following activities: development of treatment plan with patient and/or surrogate as well as nursing, discussions with consultants, evaluation of patient's response to treatment, examination of patient, obtaining history from patient or surrogate, ordering and performing treatments and interventions, ordering and review of laboratory studies, ordering and review of radiographic studies, pulse oximetry and re-evaluation of patient's condition.         Final Clinical Impression(s) / ED Diagnoses Final diagnoses:  None    Rx / DC Orders ED Discharge Orders     None          Lorre Nick, MD 08/23/21 1909

## 2021-08-23 NOTE — ED Notes (Signed)
Pt ambulated from the room to the restroom and back to the pt room with a walker

## 2021-08-23 NOTE — ED Notes (Signed)
Pt ate ham sandwich

## 2021-08-25 ENCOUNTER — Emergency Department (HOSPITAL_COMMUNITY): Payer: No Typology Code available for payment source

## 2021-08-25 ENCOUNTER — Observation Stay (HOSPITAL_COMMUNITY)
Admission: EM | Admit: 2021-08-25 | Discharge: 2021-08-27 | Disposition: A | Discharge status: Home Health | Payer: No Typology Code available for payment source | Attending: Internal Medicine | Admitting: Internal Medicine

## 2021-08-25 ENCOUNTER — Encounter (HOSPITAL_COMMUNITY): Payer: Self-pay | Admitting: Emergency Medicine

## 2021-08-25 DIAGNOSIS — K5904 Chronic idiopathic constipation: Secondary | ICD-10-CM | POA: Insufficient documentation

## 2021-08-25 DIAGNOSIS — Z79899 Other long term (current) drug therapy: Secondary | ICD-10-CM | POA: Diagnosis not present

## 2021-08-25 DIAGNOSIS — E11649 Type 2 diabetes mellitus with hypoglycemia without coma: Secondary | ICD-10-CM | POA: Diagnosis not present

## 2021-08-25 DIAGNOSIS — L97419 Non-pressure chronic ulcer of right heel and midfoot with unspecified severity: Secondary | ICD-10-CM | POA: Diagnosis not present

## 2021-08-25 DIAGNOSIS — Z7982 Long term (current) use of aspirin: Secondary | ICD-10-CM | POA: Insufficient documentation

## 2021-08-25 DIAGNOSIS — E11621 Type 2 diabetes mellitus with foot ulcer: Secondary | ICD-10-CM | POA: Diagnosis not present

## 2021-08-25 DIAGNOSIS — Z794 Long term (current) use of insulin: Secondary | ICD-10-CM | POA: Diagnosis not present

## 2021-08-25 DIAGNOSIS — N1832 Chronic kidney disease, stage 3b: Secondary | ICD-10-CM | POA: Diagnosis not present

## 2021-08-25 DIAGNOSIS — E1122 Type 2 diabetes mellitus with diabetic chronic kidney disease: Secondary | ICD-10-CM | POA: Diagnosis not present

## 2021-08-25 DIAGNOSIS — I10 Essential (primary) hypertension: Secondary | ICD-10-CM | POA: Diagnosis present

## 2021-08-25 DIAGNOSIS — E162 Hypoglycemia, unspecified: Secondary | ICD-10-CM | POA: Diagnosis present

## 2021-08-25 DIAGNOSIS — R55 Syncope and collapse: Secondary | ICD-10-CM

## 2021-08-25 DIAGNOSIS — K5909 Other constipation: Secondary | ICD-10-CM

## 2021-08-25 DIAGNOSIS — I129 Hypertensive chronic kidney disease with stage 1 through stage 4 chronic kidney disease, or unspecified chronic kidney disease: Secondary | ICD-10-CM | POA: Diagnosis not present

## 2021-08-25 DIAGNOSIS — L97409 Non-pressure chronic ulcer of unspecified heel and midfoot with unspecified severity: Secondary | ICD-10-CM

## 2021-08-25 DIAGNOSIS — E119 Type 2 diabetes mellitus without complications: Secondary | ICD-10-CM

## 2021-08-25 DIAGNOSIS — N179 Acute kidney failure, unspecified: Secondary | ICD-10-CM | POA: Diagnosis not present

## 2021-08-25 DIAGNOSIS — I440 Atrioventricular block, first degree: Secondary | ICD-10-CM

## 2021-08-25 LAB — HEMOGLOBIN A1C
Hgb A1c MFr Bld: 7.2 % — ABNORMAL HIGH (ref 4.8–5.6)
Mean Plasma Glucose: 159.94 mg/dL

## 2021-08-25 LAB — BASIC METABOLIC PANEL
Anion gap: 10 (ref 5–15)
BUN: 52 mg/dL — ABNORMAL HIGH (ref 8–23)
CO2: 21 mmol/L — ABNORMAL LOW (ref 22–32)
Calcium: 8.6 mg/dL — ABNORMAL LOW (ref 8.9–10.3)
Chloride: 109 mmol/L (ref 98–111)
Creatinine, Ser: 3.17 mg/dL — ABNORMAL HIGH (ref 0.61–1.24)
GFR, Estimated: 18 mL/min — ABNORMAL LOW (ref >60)
Glucose, Bld: 113 mg/dL — ABNORMAL HIGH (ref 70–99)
Potassium: 4.4 mmol/L (ref 3.5–5.1)
Sodium: 140 mmol/L (ref 135–145)

## 2021-08-25 LAB — CBC WITH DIFFERENTIAL/PLATELET
Abs Immature Granulocytes: 0.02 10*3/uL (ref 0.00–0.07)
Basophils Absolute: 0.1 10*3/uL (ref 0.0–0.1)
Basophils Relative: 1 %
Eosinophils Absolute: 0.2 10*3/uL (ref 0.0–0.5)
Eosinophils Relative: 2 %
HCT: 34.3 % — ABNORMAL LOW (ref 39.0–52.0)
Hemoglobin: 11.1 g/dL — ABNORMAL LOW (ref 13.0–17.0)
Immature Granulocytes: 0 %
Lymphocytes Relative: 19 %
Lymphs Abs: 1.6 10*3/uL (ref 0.7–4.0)
MCH: 29.2 pg (ref 26.0–34.0)
MCHC: 32.4 g/dL (ref 30.0–36.0)
MCV: 90.3 fL (ref 80.0–100.0)
Monocytes Absolute: 0.8 10*3/uL (ref 0.1–1.0)
Monocytes Relative: 9 %
Neutro Abs: 5.7 10*3/uL (ref 1.7–7.7)
Neutrophils Relative %: 69 %
Platelets: 175 10*3/uL (ref 150–400)
RBC: 3.8 MIL/uL — ABNORMAL LOW (ref 4.22–5.81)
RDW: 13.6 % (ref 11.5–15.5)
WBC: 8.3 10*3/uL (ref 4.0–10.5)
nRBC: 0 % (ref 0.0–0.2)

## 2021-08-25 LAB — URINALYSIS, ROUTINE W REFLEX MICROSCOPIC
Bacteria, UA: NONE SEEN
Bilirubin Urine: NEGATIVE
Glucose, UA: NEGATIVE mg/dL
Hgb urine dipstick: NEGATIVE
Ketones, ur: NEGATIVE mg/dL
Leukocytes,Ua: NEGATIVE
Nitrite: NEGATIVE
Protein, ur: 100 mg/dL — AB
Specific Gravity, Urine: 1.017 (ref 1.005–1.030)
pH: 5 (ref 5.0–8.0)

## 2021-08-25 LAB — TSH: TSH: 0.728 u[IU]/mL (ref 0.350–4.500)

## 2021-08-25 LAB — GLUCOSE, CAPILLARY: Glucose-Capillary: 120 mg/dL — ABNORMAL HIGH (ref 70–99)

## 2021-08-25 LAB — CK: Total CK: 350 U/L (ref 49–397)

## 2021-08-25 LAB — CBG MONITORING, ED
Glucose-Capillary: 112 mg/dL — ABNORMAL HIGH (ref 70–99)
Glucose-Capillary: 92 mg/dL (ref 70–99)
Glucose-Capillary: 118 mg/dL — ABNORMAL HIGH (ref 70–99)

## 2021-08-25 LAB — TROPONIN I (HIGH SENSITIVITY): Troponin I (High Sensitivity): 20 ng/L — ABNORMAL HIGH (ref <18)

## 2021-08-25 MED ORDER — SODIUM CHLORIDE 0.9 % IV SOLN: INTRAVENOUS | Status: AC

## 2021-08-25 MED ORDER — HEPARIN SODIUM (PORCINE) 5000 UNIT/ML IJ SOLN
5000.0000 [IU] | Freq: Three times a day (TID) | INTRAMUSCULAR | Status: DC
Start: 2021-08-25 — End: 2021-08-27
  Administered 2021-08-25 – 2021-08-27 (×5): 5000 [IU] via SUBCUTANEOUS
  Filled 2021-08-25 (×5): qty 1

## 2021-08-25 MED ORDER — DEXTROSE 50 % IV SOLN: 12.5000 g | Freq: Once | INTRAVENOUS | Status: DC | PRN

## 2021-08-25 MED ORDER — SODIUM CHLORIDE 0.9 % IV BOLUS
1000.0000 mL | Freq: Once | INTRAVENOUS | Status: AC
  Administered 2021-08-25: 1000 mL via INTRAVENOUS

## 2021-08-25 MED ORDER — POLYETHYLENE GLYCOL 3350 17 G PO PACK: 17.0000 g | PACK | Freq: Every day | ORAL | Status: DC | PRN

## 2021-08-25 NOTE — H&P (Signed)
History and Physical    Antonio White. EYC:[id number] DOB: 09-14-1932 DOA: 08/25/2021  PCP: Anson Fret, MD  Patient coming from: Home  Chief Complaint: Hypoglycemia  HPI: Antonio White. is a 88 y.o. male with medical history significant of insulin-dependent type 2 diabetes, hypertension, obesity (BMI 31.52), CKD stage IIIb, GERD presents to the ED via EMS for evaluation of hypoglycemia and syncope.  Patient lives by himself and when his home health nurse visited him today she found him unresponsive on the floor.  Upon EMS arrival, CBG 44 and patient was given 25 g D10, repeat CBG 114.  He was AAOx4 upon arrival to the ED.  Labs showing no leukocytosis, hemoglobin 11.1 (stable), bicarb 21, glucose 113, BUN 52, creatinine 3.1 (baseline 1.4-1.7), A1c pending, UA pending.  Chest x-ray showing no active disease.  CT head/C-spine negative for acute finding.  EKG showing sinus rhythm with first-degree AV block and new T wave inversions in lateral leads. Troponin not checked.  He was given food, juice, and 1 L normal saline bolus.  [facility] called to admit for hypoglycemia and AKI.  Patient states the last thing he remembers was sitting and watching TV and then remembers waking up with EMS around him.  He was feeling well when he was watching television and was not having any dizziness/lightheadedness, chest pain, or shortness of breath.  He is currently taking 3 different medications for diabetes but does not remember the names.  He takes 60 units of long-acting insulin every evening and 2 oral medications.  Patient states his PCP is at the [place].  States normally his blood glucose is high at night in the 200s and in the morning it drops to the 70s.  He is concerned that the dose of his insulin evening long-acting might be too high.  He reports good p.o. intake and thinks he is drinking fluids adequately.  Reports chronic constipation for which he takes laxatives and tends to have a bowel movement every  day.  Denies nausea, vomiting, or abdominal pain.  Denies fevers or any other complaints.  Review of Systems:  Review of Systems  All other systems reviewed and are negative.   Past Medical History:  Diagnosis Date   Diabetes mellitus without complication (HCC)    Hypertension     History reviewed. No pertinent surgical history.   reports that he has never smoked. He has never used smokeless tobacco. He reports that he does not currently use alcohol. He reports that he does not use drugs.  Allergies  Allergen Reactions   Metformin And Related Hives   Penicillins Hives    Did it involve swelling of the face/tongue/throat, SOB, or low BP? No Did it involve sudden or severe rash/hives, skin peeling, or any reaction on the inside of your mouth or nose? Yes Did you need to seek medical attention at a hospital or doctor's office? No When did it last happen?  Over 8 Years Ago     If all above answers are "NO", may proceed with cephalosporin use.     Pneumococcal Vaccine Swelling and Other (See Comments)    Swelling of upper limb - reported by [facility]    History reviewed. No pertinent family history.  Prior to Admission medications   Medication Sig Start Date End Date Taking? Authorizing Provider  Alogliptin Benzoate 25 MG TABS Take 25 mg by mouth every morning.    [provider]  amLODipine (NORVASC) 10 MG tablet Take 1 tablet (  10 mg total) by mouth at bedtime. 07/23/21 07/23/22  Swayze, Ava, DO  aspirin EC 81 MG tablet Take 81 mg by mouth daily. Swallow whole.    [provider]  atenolol (TENORMIN) 100 MG tablet Take 100 mg by mouth daily.    [provider]  famotidine (PEPCID) 20 MG tablet Take 20 mg by mouth at bedtime.    [provider]  feeding supplement (ENSURE ENLIVE / ENSURE PLUS) LIQD Take 237 mLs by mouth 2 (two) times daily between meals. 07/23/21   Swayze, Ava, DO  insulin glargine-yfgn (SEMGLEE) 100 UNIT/ML injection Inject 0.35 mLs  (35 Units total) into the skin at bedtime. 07/23/21   Swayze, Ava, DO  latanoprost (XALATAN) 0.005 % ophthalmic solution Place 1 drop into both eyes at bedtime.    [provider]  Multiple Vitamins-Minerals (CENTRUM SILVER 50+MEN) TABS Take 1 tablet by mouth daily with breakfast.    [provider]  timolol (TIMOPTIC-XR) 0.5 % ophthalmic gel-forming Place 1 drop into the right eye every morning.    [provider]    Physical Exam: Vitals:   08/25/21 1754 08/25/21 1758 08/25/21 1900  BP:  (!) 165/69 (!) 144/54  Pulse:  62 (!) 57  Resp:  20 14  Temp:  98.1 F (36.7 C)   TempSrc:  Oral   SpO2: 98% 100% 100%  Weight:  102.5 kg   Height:  5\' 11"  (1.803 m)     Physical Exam Vitals reviewed.  Constitutional:      General: He is not in acute distress. HENT:     Head: Normocephalic and atraumatic.     Mouth/Throat:     Mouth: Mucous membranes are dry.  Eyes:     Extraocular Movements: Extraocular movements intact.  Cardiovascular:     Rate and Rhythm: Normal rate and regular rhythm.     Pulses: Normal pulses.     Heart sounds: Murmur heard.     Comments: Murmur best appreciated at the right upper sternal border second intercostal space Pulmonary:     Effort: Pulmonary effort is normal. No respiratory distress.     Breath sounds: Normal breath sounds. No wheezing or rales.  Abdominal:     General: Bowel sounds are normal.     Palpations: Abdomen is soft.     Tenderness: There is no abdominal tenderness. There is no guarding.  Musculoskeletal:     Cervical back: Normal range of motion.     Right lower leg: Edema present.     Left lower leg: Edema present.     Comments: +1 bilateral pedal edema Right foot: Superficial ulcer noted on the lateral aspect of the heel without obvious drainage, erythema, tenderness, or signs of infection.  Dorsalis pedis pulse intact.  Skin:    General: Skin is warm and dry.  Neurological:     General: No focal deficit  present.     Mental Status: He is alert and oriented to person, place, and time.      Labs on Admission: I have personally reviewed following labs and imaging studies  CBC: Recent Labs  Lab 08/23/21 1251 08/23/21 1410 08/25/21 1801  WBC TEST REQUEST RECEIVED WITHOUT APPROPRIATE SPECIMEN NOTIFIED KAREN IN ED 11.2* 8.3  NEUTROABS PENDING 8.9* 5.7  HGB TEST REQUEST RECEIVED WITHOUT APPROPRIATE SPECIMEN NOTIFIED KAREN IN ED 11.1* 11.1*  HCT TEST REQUEST RECEIVED WITHOUT APPROPRIATE SPECIMEN NOTIFIED KAREN IN ED 33.8* 34.3*  MCV TEST REQUEST RECEIVED WITHOUT APPROPRIATE SPECIMEN NOTIFIED KAREN IN  ED 88.9 90.3  PLT TEST REQUEST RECEIVED WITHOUT APPROPRIATE SPECIMEN NOTIFIED KAREN IN ED 168 175   Basic Metabolic Panel: Recent Labs  Lab 08/23/21 1251 08/25/21 1801  NA 141 140  K 4.1 4.4  CL 106 109  CO2 26 21*  GLUCOSE 77 113*  BUN 36* 52*  CREATININE 1.72* 3.17*  CALCIUM 9.0 8.6*   GFR: Estimated Creatinine Clearance: 20 mL/min (A) (by C-G formula based on SCr of 3.17 mg/dL (H)). Liver Function Tests: No results for input(s): "AST", "ALT", "ALKPHOS", "BILITOT", "PROT", "ALBUMIN" in the last 168 hours. No results for input(s): "LIPASE", "AMYLASE" in the last 168 hours. No results for input(s): "AMMONIA" in the last 168 hours. Coagulation Profile: No results for input(s): "INR", "PROTIME" in the last 168 hours. Cardiac Enzymes: No results for input(s): "CKTOTAL", "CKMB", "CKMBINDEX", "TROPONINI" in the last 168 hours. BNP (last 3 results) No results for input(s): "PROBNP" in the last 8760 hours. HbA1C: No results for input(s): "HGBA1C" in the last 72 hours. CBG: Recent Labs  Lab 08/23/21 1704 08/23/21 1719 08/23/21 1829 08/25/21 1753 08/25/21 1910  GLUCAP 102* 91 184* 112* 92   Lipid Profile: No results for input(s): "CHOL", "HDL", "LDLCALC", "TRIG", "CHOLHDL", "LDLDIRECT" in the last 72 hours. Thyroid Function Tests: No results for input(s): "TSH", "T4TOTAL",  "FREET4", "T3FREE", "THYROIDAB" in the last 72 hours. Anemia Panel: No results for input(s): "VITAMINB12", "FOLATE", "FERRITIN", "TIBC", "IRON", "RETICCTPCT" in the last 72 hours. Urine analysis:    Component Value Date/Time   COLORURINE YELLOW 08/23/2021 1816   APPEARANCEUR CLEAR 08/23/2021 1816   LABSPEC 1.015 08/23/2021 1816   PHURINE 5.0 08/23/2021 1816   GLUCOSEU NEGATIVE 08/23/2021 1816   HGBUR SMALL (A) 08/23/2021 1816   BILIRUBINUR NEGATIVE 08/23/2021 1816   KETONESUR NEGATIVE 08/23/2021 1816   PROTEINUR >=300 (A) 08/23/2021 1816   NITRITE NEGATIVE 08/23/2021 1816   LEUKOCYTESUR NEGATIVE 08/23/2021 1816    Radiological Exams on Admission: I have personally reviewed images CT Head Wo Contrast  Result Date: 08/25/2021 CLINICAL DATA:  Head trauma, moderate-severe; Neck trauma (Age >= 65y). Hypoglycemia EXAM: CT HEAD WITHOUT CONTRAST CT CERVICAL SPINE WITHOUT CONTRAST TECHNIQUE: Multidetector CT imaging of the head and cervical spine was performed following the standard protocol without intravenous contrast. Multiplanar CT image reconstructions of the cervical spine were also generated. RADIATION DOSE REDUCTION: This exam was performed according to the departmental dose-optimization program which includes automated exposure control, adjustment of the mA and/or kV according to patient size and/or use of iterative reconstruction technique. COMPARISON:  CT head 08/23/2021 FINDINGS: CT HEAD FINDINGS Brain: Normal anatomic configuration. Parenchymal volume loss is commensurate with the patient's age. Mild stable periventricular white matter changes are present likely reflecting the sequela of small vessel ischemia. No abnormal intra or extra-axial mass lesion or fluid collection. No abnormal mass effect or midline shift. No evidence of acute intracranial hemorrhage or infarct. Ventricular size is normal. Cerebellum unremarkable. Vascular: No asymmetric hyperdense vasculature at the skull base.  Skull: Intact Sinuses/Orbits: Paranasal sinuses are clear. Orbits are unremarkable. Other: Mastoid air cells and middle ear cavities are clear. CT CERVICAL SPINE FINDINGS Alignment: Normal. Skull base and vertebrae: Craniocervical alignment is normal. The atlantodental interval is not widened. No acute fracture of the cervical spine. Vertebral body height is preserved. Soft tissues and spinal canal: No prevertebral fluid or swelling. No visible canal hematoma. Disc levels: Intervertebral disc heights are preserved. Prevertebral soft tissues are not thickened on sagittal reformats. Spinal canal is widely patent. Uncovertebral arthrosis results  in moderate to severe right neuroforaminal narrowing at C3-4 and moderate bilateral neuroforaminal narrowing at C4-5. Remaining neural foramina are widely patent. Upper chest: Negative. Other: None IMPRESSION: 1. No acute intracranial abnormality. No calvarial fracture. 2. No acute fracture or listhesis of the cervical spine. Electronically Signed   By: Helyn Numbers M.D.   On: 08/25/2021 19:14   CT Cervical Spine Wo Contrast  Result Date: 08/25/2021 CLINICAL DATA:  Head trauma, moderate-severe; Neck trauma (Age >= 65y). Hypoglycemia EXAM: CT HEAD WITHOUT CONTRAST CT CERVICAL SPINE WITHOUT CONTRAST TECHNIQUE: Multidetector CT imaging of the head and cervical spine was performed following the standard protocol without intravenous contrast. Multiplanar CT image reconstructions of the cervical spine were also generated. RADIATION DOSE REDUCTION: This exam was performed according to the departmental dose-optimization program which includes automated exposure control, adjustment of the mA and/or kV according to patient size and/or use of iterative reconstruction technique. COMPARISON:  CT head 08/23/2021 FINDINGS: CT HEAD FINDINGS Brain: Normal anatomic configuration. Parenchymal volume loss is commensurate with the patient's age. Mild stable periventricular white matter  changes are present likely reflecting the sequela of small vessel ischemia. No abnormal intra or extra-axial mass lesion or fluid collection. No abnormal mass effect or midline shift. No evidence of acute intracranial hemorrhage or infarct. Ventricular size is normal. Cerebellum unremarkable. Vascular: No asymmetric hyperdense vasculature at the skull base. Skull: Intact Sinuses/Orbits: Paranasal sinuses are clear. Orbits are unremarkable. Other: Mastoid air cells and middle ear cavities are clear. CT CERVICAL SPINE FINDINGS Alignment: Normal. Skull base and vertebrae: Craniocervical alignment is normal. The atlantodental interval is not widened. No acute fracture of the cervical spine. Vertebral body height is preserved. Soft tissues and spinal canal: No prevertebral fluid or swelling. No visible canal hematoma. Disc levels: Intervertebral disc heights are preserved. Prevertebral soft tissues are not thickened on sagittal reformats. Spinal canal is widely patent. Uncovertebral arthrosis results in moderate to severe right neuroforaminal narrowing at C3-4 and moderate bilateral neuroforaminal narrowing at C4-5. Remaining neural foramina are widely patent. Upper chest: Negative. Other: None IMPRESSION: 1. No acute intracranial abnormality. No calvarial fracture. 2. No acute fracture or listhesis of the cervical spine. Electronically Signed   By: Helyn Numbers M.D.   On: 08/25/2021 19:14   DG Chest Portable 1 View  Result Date: 08/25/2021 CLINICAL DATA:  Fall EXAM: PORTABLE CHEST 1 VIEW COMPARISON:  07/19/2021 FINDINGS: The heart size and mediastinal contours are within normal limits. Aortic atherosclerosis. Both lungs are clear. The visualized skeletal structures are unremarkable. IMPRESSION: No active disease. Electronically Signed   By: Jasmine Pang M.D.   On: 08/25/2021 18:33    EKG: Independently reviewed.  Sinus rhythm with first-degree AV block and new T wave inversions in lateral leads.  Assessment  and Plan  Hypoglycemia in the setting of insulin-dependent type 2 diabetes Suspect this might be due to the dose of his evening long-acting insulin being too high as he reports having low blood glucose values in the morning.  Also takes 2 oral hypoglycemic agents.  He is followed at the [place] and his current medication list is not available at this time.  Last A1c 7.1 on 07/19/2021.  Glucose now improved after dextrose and oral intake. -Hold insulin/oral hypoglycemic agents at this time, dose of long-acting insulin will have to be reduced.  Unknown whether he is on a sulfonylurea? Medication adjustments will have to be made after pharmacy med rec is done. -Encourage p.o. intake/regular diet at this time -CBG checks every  2 hours -Hypoglycemia protocol -Repeat A1c pending  Syncope Likely due to hypoglycemia.  Mental status improved with improvement of blood glucose.  CT head negative for acute finding and no focal neurodeficit.  EKG showing new T wave inversions in lateral leads, however, patient is not endorsing any chest pain.  He may have aortic stenosis as I appreciated a murmur on exam in this region.  No prior echo in the chart. -Cardiac monitoring -Stat troponin -Echocardiogram -Management of hypoglycemia  AKI on CKD stage IIIb Likely prerenal from dehydration, has dry mucous membranes.  BUN 52, creatinine 3.1 (baseline 1.4-1.7).  Unknown whether he takes any nephrotoxic medications at home. -IV fluid hydration -Monitor renal function and urine output -Avoid nephrotoxic agents/pharmacy med rec pending -Check CK (found on the floor/?fall)  First-degree AV block -Cardiac monitoring -Check TSH  Chronic constipation No abdominal pain, nausea, or vomiting.  Bowel sounds present on exam. -MiraLAX as needed  Right heel ulcer Appears superficial and without signs of infection.  Dorsalis pedis pulse intact. -Wound care  Hypertension GERD -Continue home meds after pharmacy med rec is  done.  DVT prophylaxis: SQ Heparin Code Status: Full Code (discussed with the patient) Family Communication: No family available at this time. Level of care: Progressive Care Unit Admission status: It is my clinical opinion that referral for OBSERVATION is reasonable and necessary in this patient based on the above information provided. The aforementioned taken together are felt to place the patient at high risk for further clinical deterioration. However, it is anticipated that the patient may be medically stable for discharge from the hospital within 24 to 48 hours.   John Giovanni MD [facility]  If 7PM-7AM, please contact night-coverage [email or web address]  08/25/2021, 8:02 PM

## 2021-08-25 NOTE — ED Provider Notes (Signed)
[facility]-EMERGENCY DEPT Provider Note   CSN: [id number] Arrival date & time: 08/25/21  1746     History  Chief Complaint  Patient presents with   Hypoglycemia    Antonio White. is a 88 y.o. male.  Patient brought here by EMS from home after hypoglycemic event.  Blood sugar in the 40s on scene.  He was given 25 g of D10.  Blood sugar on recheck is 114.  Patient lives by himself.  He has a nurse that comes over at times.  She came by and found him on the floor supposedly.  Called EMS and found his blood sugar to be low.  He states that this happened earlier this month as well.  He is on long-acting insulin that he takes at night as well as oral diabetic medicine as well.  States he remembers eating breakfast this morning and then watching TV.  He thinks the last thing he remembers was around 1130 this morning.  He is not on blood thinners.  He is not having any chest pain or shortness of breath currently.  The history is provided by the patient and the EMS personnel.       Home Medications Prior to Admission medications   Medication Sig Start Date End Date Taking? Authorizing Provider  Alogliptin Benzoate 25 MG TABS Take 25 mg by mouth every morning.    [provider]  amLODipine (NORVASC) 10 MG tablet Take 1 tablet (10 mg total) by mouth at bedtime. 07/23/21 07/23/22  Swayze, Ava, DO  aspirin EC 81 MG tablet Take 81 mg by mouth daily. Swallow whole.    [provider]  atenolol (TENORMIN) 100 MG tablet Take 100 mg by mouth daily.    [provider]  famotidine (PEPCID) 20 MG tablet Take 20 mg by mouth at bedtime.    [provider]  feeding supplement (ENSURE ENLIVE / ENSURE PLUS) LIQD Take 237 mLs by mouth 2 (two) times daily between meals. 07/23/21   Swayze, Ava, DO  insulin glargine-yfgn (SEMGLEE) 100 UNIT/ML injection Inject 0.35 mLs (35 Units total) into the skin at bedtime. 07/23/21   Swayze, Ava, DO  latanoprost  (XALATAN) 0.005 % ophthalmic solution Place 1 drop into both eyes at bedtime.    [provider]  Multiple Vitamins-Minerals (CENTRUM SILVER 50+MEN) TABS Take 1 tablet by mouth daily with breakfast.    [provider]  timolol (TIMOPTIC-XR) 0.5 % ophthalmic gel-forming Place 1 drop into the right eye every morning.    [provider]      Allergies    Metformin and related, Penicillins, and Pneumococcal vaccine    Review of Systems   Review of Systems  Physical Exam Updated Vital Signs BP (!) 144/54   Pulse (!) 57   Temp 98.1 F (36.7 C) (Oral)   Resp 14   Ht 5\' 11"  (1.803 m)   Wt 102.5 kg   SpO2 100%   BMI 31.52 kg/m  Physical Exam Vitals and nursing note reviewed.  Constitutional:      General: He is not in acute distress.    Appearance: He is well-developed.  HENT:     Head: Normocephalic and atraumatic.     Mouth/Throat:     Mouth: Mucous membranes are moist.  Eyes:     Extraocular Movements: Extraocular movements intact.     Conjunctiva/sclera: Conjunctivae normal.     Pupils: Pupils are equal, round, and reactive to light.  Cardiovascular:  Rate and Rhythm: Normal rate and regular rhythm.     Heart sounds: No murmur heard. Pulmonary:     Effort: Pulmonary effort is normal. No respiratory distress.     Breath sounds: Normal breath sounds.  Abdominal:     Palpations: Abdomen is soft.     Tenderness: There is no abdominal tenderness.  Musculoskeletal:        General: No swelling.     Cervical back: Neck supple.  Skin:    General: Skin is warm and dry.     Capillary Refill: Capillary refill takes less than 2 seconds.  Neurological:     General: No focal deficit present.     Mental Status: He is alert.  Psychiatric:        Mood and Affect: Mood normal.     ED Results / Procedures / Treatments   Labs (all labs ordered are listed, but only abnormal results are displayed) Labs Reviewed  CBC WITH DIFFERENTIAL/PLATELET -  Abnormal; Notable for the following components:      Result Value   RBC 3.80 (*)    Hemoglobin 11.1 (*)    HCT 34.3 (*)    All other components within normal limits  BASIC METABOLIC PANEL - Abnormal; Notable for the following components:   CO2 21 (*)    Glucose, Bld 113 (*)    BUN 52 (*)    Creatinine, Ser 3.17 (*)    Calcium 8.6 (*)    GFR, Estimated 18 (*)    All other components within normal limits  CBG MONITORING, ED - Abnormal; Notable for the following components:   Glucose-Capillary 112 (*)    All other components within normal limits  URINALYSIS, ROUTINE W REFLEX MICROSCOPIC  HEMOGLOBIN A1C  CBG MONITORING, ED  CBG MONITORING, ED    EKG EKG Interpretation  Date/Time:  Monday August 25 2021 19:14:30 EDT Ventricular Rate:  57 PR Interval:  207 QRS Duration: 106 QT Interval:  429 QTC Calculation: 418 R Axis:   62 Text Interpretation: Sinus rhythm Confirmed by Antonio White ([id number]) on 08/25/2021 7:17:05 PM  Radiology CT Head Wo Contrast  Result Date: 08/25/2021 CLINICAL DATA:  Head trauma, moderate-severe; Neck trauma (Age >= 65y). Hypoglycemia EXAM: CT HEAD WITHOUT CONTRAST CT CERVICAL SPINE WITHOUT CONTRAST TECHNIQUE: Multidetector CT imaging of the head and cervical spine was performed following the standard protocol without intravenous contrast. Multiplanar CT image reconstructions of the cervical spine were also generated. RADIATION DOSE REDUCTION: This exam was performed according to the departmental dose-optimization program which includes automated exposure control, adjustment of the mA and/or kV according to patient size and/or use of iterative reconstruction technique. COMPARISON:  CT head 08/23/2021 FINDINGS: CT HEAD FINDINGS Brain: Normal anatomic configuration. Parenchymal volume loss is commensurate with the patient's age. Mild stable periventricular white matter changes are present likely reflecting the sequela of small vessel ischemia. No abnormal intra or  extra-axial mass lesion or fluid collection. No abnormal mass effect or midline shift. No evidence of acute intracranial hemorrhage or infarct. Ventricular size is normal. Cerebellum unremarkable. Vascular: No asymmetric hyperdense vasculature at the skull base. Skull: Intact Sinuses/Orbits: Paranasal sinuses are clear. Orbits are unremarkable. Other: Mastoid air cells and middle ear cavities are clear. CT CERVICAL SPINE FINDINGS Alignment: Normal. Skull base and vertebrae: Craniocervical alignment is normal. The atlantodental interval is not widened. No acute fracture of the cervical spine. Vertebral body height is preserved. Soft tissues and spinal canal: No prevertebral fluid or swelling. No visible canal hematoma.  Disc levels: Intervertebral disc heights are preserved. Prevertebral soft tissues are not thickened on sagittal reformats. Spinal canal is widely patent. Uncovertebral arthrosis results in moderate to severe right neuroforaminal narrowing at C3-4 and moderate bilateral neuroforaminal narrowing at C4-5. Remaining neural foramina are widely patent. Upper chest: Negative. Other: None IMPRESSION: 1. No acute intracranial abnormality. No calvarial fracture. 2. No acute fracture or listhesis of the cervical spine. Electronically Signed   By: Helyn Numbers M.D.   On: 08/25/2021 19:14   CT Cervical Spine Wo Contrast  Result Date: 08/25/2021 CLINICAL DATA:  Head trauma, moderate-severe; Neck trauma (Age >= 65y). Hypoglycemia EXAM: CT HEAD WITHOUT CONTRAST CT CERVICAL SPINE WITHOUT CONTRAST TECHNIQUE: Multidetector CT imaging of the head and cervical spine was performed following the standard protocol without intravenous contrast. Multiplanar CT image reconstructions of the cervical spine were also generated. RADIATION DOSE REDUCTION: This exam was performed according to the departmental dose-optimization program which includes automated exposure control, adjustment of the mA and/or kV according to patient  size and/or use of iterative reconstruction technique. COMPARISON:  CT head 08/23/2021 FINDINGS: CT HEAD FINDINGS Brain: Normal anatomic configuration. Parenchymal volume loss is commensurate with the patient's age. Mild stable periventricular white matter changes are present likely reflecting the sequela of small vessel ischemia. No abnormal intra or extra-axial mass lesion or fluid collection. No abnormal mass effect or midline shift. No evidence of acute intracranial hemorrhage or infarct. Ventricular size is normal. Cerebellum unremarkable. Vascular: No asymmetric hyperdense vasculature at the skull base. Skull: Intact Sinuses/Orbits: Paranasal sinuses are clear. Orbits are unremarkable. Other: Mastoid air cells and middle ear cavities are clear. CT CERVICAL SPINE FINDINGS Alignment: Normal. Skull base and vertebrae: Craniocervical alignment is normal. The atlantodental interval is not widened. No acute fracture of the cervical spine. Vertebral body height is preserved. Soft tissues and spinal canal: No prevertebral fluid or swelling. No visible canal hematoma. Disc levels: Intervertebral disc heights are preserved. Prevertebral soft tissues are not thickened on sagittal reformats. Spinal canal is widely patent. Uncovertebral arthrosis results in moderate to severe right neuroforaminal narrowing at C3-4 and moderate bilateral neuroforaminal narrowing at C4-5. Remaining neural foramina are widely patent. Upper chest: Negative. Other: None IMPRESSION: 1. No acute intracranial abnormality. No calvarial fracture. 2. No acute fracture or listhesis of the cervical spine. Electronically Signed   By: Helyn Numbers M.D.   On: 08/25/2021 19:14   DG Chest Portable 1 View  Result Date: 08/25/2021 CLINICAL DATA:  Fall EXAM: PORTABLE CHEST 1 VIEW COMPARISON:  07/19/2021 FINDINGS: The heart size and mediastinal contours are within normal limits. Aortic atherosclerosis. Both lungs are clear. The visualized skeletal  structures are unremarkable. IMPRESSION: No active disease. Electronically Signed   By: Jasmine Pang M.D.   On: 08/25/2021 18:33    Procedures Procedures    Medications Ordered in ED Medications  sodium chloride 0.9 % bolus 1,000 mL (has no administration in time range)    ED Course/ Medical Decision Making/ A&P                           Medical Decision Making Amount and/or Complexity of Data Reviewed Labs: ordered. Radiology: ordered.   Burnetta Sabin. is here after suspected syncopal event.  Patient lives by himself.  Normal vitals.  No fever.  History of diabetes on insulin and oral diabetes medications.  History of hypertension.  He was found unresponsive by his home nurse.  He last remembers  being awake around 11:30 in the morning, was found around 5:00.  Blood sugar by EMS was 44, was given 25 g of D10 and blood sugars improved and his mentation is greatly improved.  He states that he had similar event several weeks ago.  Did not have any adjustments made to his medications.  Neurologically he is intact.  He looks clinically dehydrated.  He has no signs of trauma on exam.  Suspect that this is hypoglycemia in the setting of medication and poor p.o. intake.  Check labs and electrolytes.  Blood sugar upon arrival here is 113.  Per my review and interpretation of labs his creatinine is elevated to 3.17.  His electrolytes are otherwise unremarkable.  He has been able to eat and drink.  His blood sugar most recently is 92.  CT scan of the head, neck were obtained to evaluate for traumatic processes and are unremarkable.  Chest x-ray shows no evidence of pneumonia or pneumothorax.  Overall, patient with AKI, another hypoglycemic event 2 in the past month.  He lives by himself.  I think he needs medication adjustment and observation stay to be hydrated and get some education about his diabetic regimen.  Patient to be admitted to medicine.  I do not think he is safe being home by himself  without having a more consistent diabetes plan and hydration.  This chart was dictated using voice recognition software.  Despite best efforts to proofread,  errors can occur which can change the documentation meaning.         Final Clinical Impression(s) / ED Diagnoses Final diagnoses:  Hypoglycemia  AKI (acute kidney injury) [facility])    Rx / DC Orders ED Discharge Orders     None         Antonio Norfolk, DO 08/25/21 1947

## 2021-08-25 NOTE — ED Notes (Signed)
Pt given peanut butter, graham crackers, apple juice.

## 2021-08-25 NOTE — ED Triage Notes (Signed)
Pt arriving via [facility] from home for hypoglycemia. First CBG was 44 on scene, pt was given 25g D10, CBG recheck 114. Pt A&O x4 upon arrival.

## 2021-08-26 ENCOUNTER — Observation Stay (HOSPITAL_BASED_OUTPATIENT_CLINIC_OR_DEPARTMENT_OTHER): Payer: No Typology Code available for payment source

## 2021-08-26 DIAGNOSIS — Z794 Long term (current) use of insulin: Secondary | ICD-10-CM

## 2021-08-26 DIAGNOSIS — R55 Syncope and collapse: Secondary | ICD-10-CM | POA: Diagnosis not present

## 2021-08-26 DIAGNOSIS — E11649 Type 2 diabetes mellitus with hypoglycemia without coma: Secondary | ICD-10-CM | POA: Diagnosis not present

## 2021-08-26 DIAGNOSIS — E162 Hypoglycemia, unspecified: Secondary | ICD-10-CM | POA: Diagnosis not present

## 2021-08-26 DIAGNOSIS — R9431 Abnormal electrocardiogram [ECG] [EKG]: Secondary | ICD-10-CM

## 2021-08-26 DIAGNOSIS — N179 Acute kidney failure, unspecified: Secondary | ICD-10-CM | POA: Diagnosis not present

## 2021-08-26 LAB — ECHOCARDIOGRAM COMPLETE
AR max vel: 1.55 cm2
AV Area VTI: 1.36 cm2
AV Area mean vel: 1.69 cm2
AV Mean grad: 21 mmHg
AV Peak grad: 35.9 mmHg
Ao pk vel: 3 m/s
Area-P 1/2: 3.28 cm2
Height: 71 in
S' Lateral: 2 cm
Weight: 3616 oz

## 2021-08-26 LAB — BASIC METABOLIC PANEL
Anion gap: 4 — ABNORMAL LOW (ref 5–15)
BUN: 45 mg/dL — ABNORMAL HIGH (ref 8–23)
CO2: 22 mmol/L (ref 22–32)
Calcium: 8.2 mg/dL — ABNORMAL LOW (ref 8.9–10.3)
Chloride: 119 mmol/L — ABNORMAL HIGH (ref 98–111)
Creatinine, Ser: 2.56 mg/dL — ABNORMAL HIGH (ref 0.61–1.24)
GFR, Estimated: 24 mL/min — ABNORMAL LOW (ref >60)
Glucose, Bld: 111 mg/dL — ABNORMAL HIGH (ref 70–99)
Potassium: 3.6 mmol/L (ref 3.5–5.1)
Sodium: 145 mmol/L (ref 135–145)

## 2021-08-26 LAB — GLUCOSE, CAPILLARY
Glucose-Capillary: 116 mg/dL — ABNORMAL HIGH (ref 70–99)
Glucose-Capillary: 120 mg/dL — ABNORMAL HIGH (ref 70–99)
Glucose-Capillary: 136 mg/dL — ABNORMAL HIGH (ref 70–99)
Glucose-Capillary: 137 mg/dL — ABNORMAL HIGH (ref 70–99)
Glucose-Capillary: 156 mg/dL — ABNORMAL HIGH (ref 70–99)
Glucose-Capillary: 174 mg/dL — ABNORMAL HIGH (ref 70–99)
Glucose-Capillary: 204 mg/dL — ABNORMAL HIGH (ref 70–99)
Glucose-Capillary: 81 mg/dL (ref 70–99)
Glucose-Capillary: 89 mg/dL (ref 70–99)

## 2021-08-26 MED ORDER — ASPIRIN 81 MG PO TBEC
81.0000 mg | DELAYED_RELEASE_TABLET | Freq: Every day | ORAL | Status: DC
  Administered 2021-08-27: 81 mg via ORAL
  Filled 2021-08-26: qty 1

## 2021-08-26 MED ORDER — PERFLUTREN LIPID MICROSPHERE
1.0000 mL | INTRAVENOUS | Status: AC | PRN
  Administered 2021-08-26: 4 mL via INTRAVENOUS

## 2021-08-26 MED ORDER — TIMOLOL MALEATE 0.5 % OP SOLN
1.0000 [drp] | Freq: Every morning | OPHTHALMIC | Status: DC
  Administered 2021-08-27: 1 [drp] via OPHTHALMIC
  Filled 2021-08-26: qty 5

## 2021-08-26 MED ORDER — AMLODIPINE BESYLATE 10 MG PO TABS
10.0000 mg | ORAL_TABLET | Freq: Every day | ORAL | Status: DC
  Administered 2021-08-26: 10 mg via ORAL
  Filled 2021-08-26: qty 1

## 2021-08-26 MED ORDER — LATANOPROST 0.005 % OP SOLN
1.0000 [drp] | Freq: Every day | OPHTHALMIC | Status: DC
Start: 2021-08-26 — End: 2021-08-27
  Administered 2021-08-26: 1 [drp] via OPHTHALMIC
  Filled 2021-08-26: qty 2.5

## 2021-08-26 MED ORDER — VANCOMYCIN HCL 125 MG PO CAPS
125.0000 mg | ORAL_CAPSULE | Freq: Four times a day (QID) | ORAL | Status: DC
  Filled 2021-08-26: qty 1

## 2021-08-26 MED ORDER — FAMOTIDINE 20 MG PO TABS
20.0000 mg | ORAL_TABLET | Freq: Every day | ORAL | Status: DC
  Administered 2021-08-26: 20 mg via ORAL
  Filled 2021-08-26: qty 1

## 2021-08-26 MED ORDER — ATENOLOL 50 MG PO TABS
100.0000 mg | ORAL_TABLET | Freq: Every day | ORAL | Status: DC
  Administered 2021-08-26 – 2021-08-27 (×2): 100 mg via ORAL
  Filled 2021-08-26 (×2): qty 2

## 2021-08-26 NOTE — Hospital Course (Addendum)
88 year old male PMH including diabetes mellitus on insulin presented with hyperglycemia and syncope.  Admitted for hypoglycemia and AKI.  Discharged in July on alogliptin, Lantus 35 units nightly.  Glipizide was discontinued.  He reports he has been taking 60 units of Lantus in addition to alogliptin and glipizide.  After careful review, hypoglycemia seems easily explained by excessive insulin and oral antihyperglycemics.  Monitor overnight, if stable blood sugar, anticipate discharge tomorrow.

## 2021-08-26 NOTE — TOC Initial Note (Signed)
Transition of Care (TOC) - Initial/Assessment Note    Patient Details  Name: Antonio White. MRN: [id number] Date of Birth: [birth year]  Transition of Care [facility]) CM/SW Contact:    Lanier Clam, RN Phone Number: 08/26/2021, 2:02 PM  Clinical Narrative:  Active w/[facility]-HHPTT/OT/MSW. PT eval-await recc. Spoke to brother Johnston Ebbs will confirm insurance [employer] medicare to bring in card.                 Expected Discharge Plan: Home w Home Health Services Barriers to Discharge: Continued Medical Work up   Patient Goals and CMS Choice   CMS Medicare.gov Compare Post Acute Care list provided to:: Patient Represenative (must comment) ([name]) Choice offered to / list presented to : Sibling  Expected Discharge Plan and Services Expected Discharge Plan: Home w Home Health Services   Discharge Planning Services: CM Consult   Living arrangements for the past 2 months: Single Family Home                                      Prior Living Arrangements/Services Living arrangements for the past 2 months: Single Family Home Lives with:: Self Patient language and need for interpreter reviewed:: Yes Do you feel safe going back to the place where you live?: Yes      Need for Family Participation in Patient Care: Yes (Comment) Care giver support system in place?: Yes (comment) Current home services: DME, Home PT, Home OT, Other (comment) (rw,Active w/[facility]-HHPT/OT/MSW) Criminal Activity/Legal Involvement Pertinent to Current Situation/Hospitalization: No - Comment as needed  Activities of Daily Living Home Assistive Devices/Equipment: Eyeglasses, Environmental consultant (specify type), Cane (specify quad or straight), Dentures (specify type) (reading glasses, full dentures) ADL Screening (condition at time of admission) Patient's cognitive ability adequate to safely complete daily activities?: Yes Is the patient deaf or have difficulty hearing?: No Does the patient have difficulty  seeing, even when wearing glasses/contacts?: No Does the patient have difficulty concentrating, remembering, or making decisions?: No Patient able to express need for assistance with ADLs?: Yes Does the patient have difficulty dressing or bathing?: No Independently performs ADLs?: Yes (appropriate for developmental age) Does the patient have difficulty walking or climbing stairs?: No Weakness of Legs: Right (right knee is weak) Weakness of Arms/Hands: None  Permission Sought/Granted Permission sought to share information with : Case Manager Permission granted to share information with : Yes, Verbal Permission Granted  Share Information with NAME:  (Case manager)           Emotional Assessment Appearance:: Appears stated age Attitude/Demeanor/Rapport: Gracious Affect (typically observed): Accepting Orientation: : Oriented to Self, Oriented to Place, Oriented to  Time, Oriented to Situation Alcohol / Substance Use: Not Applicable Psych Involvement: No (comment)  Admission diagnosis:  Hypoglycemia [E16.2] AKI (acute kidney injury) (HCC) [N17.9] Patient Active Problem List   Diagnosis Date Noted   Hypoglycemia 08/25/2021   Syncope 08/25/2021   First degree AV block 08/25/2021   Chronic constipation 08/25/2021   Heel ulcer (HCC) 08/25/2021   Dehydration    Hypertension, essential 07/19/2021   CKD stage G3b/A1, GFR 30-44 and albumin creatinine ratio <30 mg/g (HCC) 07/19/2021   Obesity 07/19/2021   T2DM (type 2 diabetes mellitus) (HCC) 07/19/2021   Primary open-angle glaucoma, right eye, moderate stage 07/19/2021   AKI (acute kidney injury) (HCC) 07/19/2021   Rhabdomyolysis 07/19/2021   Fall at home, initial encounter 07/19/2021   PCP:  Anson Fret,  MD Pharmacy:   [facility] - [place], [place] - [address] [place] [address] Phone: [phone] Fax: [phone]     Social  Determinants of Health (SDOH) Interventions    Readmission Risk Interventions     No data to display

## 2021-08-26 NOTE — Inpatient Diabetes Management (Signed)
Inpatient Diabetes Program Recommendations  AACE/ADA: New Consensus Statement on Inpatient Glycemic Control (2015)  Target Ranges:  Prepandial:   less than 140 mg/dL      Peak postprandial:   less than 180 mg/dL (1-2 hours)      Critically ill patients:  140 - 180 mg/dL   Lab Results  Component Value Date   GLUCAP 174 (H) 08/26/2021   HGBA1C 7.2 (H) 08/25/2021    Review of Glycemic Control  Diabetes history: DM2 Outpatient Diabetes medications: Alogliptin 25 mg QAM, glipizide 10 mg BID, Lantus 60 QHS Current orders for Inpatient glycemic control: None  HgbA1C - 7.2% Hypoglycemia with CBG of 44 on admission to ED  Inpatient Diabetes Program Recommendations:    Spoke with pt at bedside regarding his diabetes control at home. Pt states his blood sugar seems to be higher during the day and wakes up in the morning with low blood sugar. Pt states his doctor increased his Lantus to 60 units QHS and pt states "it's too much." Discussed HgbA1C of 7.2%. Pt states he goes to [place] for his primary care. Pt checks blood sugars several times/day. Discussed dangers of low blood sugars. States he eats healthy and tries to take care of himself.  Would not send home on DPP4 or glipizide. Decrease Lantus dose to 20 units QHS. Instructed to monitor blood sugars more frequently throughout the day. Discussed hypoglycemia s/s and treatment. Pt appreciative of visit. Answered all questions.   Thank you. Ailene Ards, RD, LDN, CDCES Inpatient Diabetes Coordinator [phone]

## 2021-08-26 NOTE — Plan of Care (Signed)
  Problem: Nutrition: Goal: Adequate nutrition will be maintained Outcome: Progressing   Problem: Clinical Measurements: Goal: Diagnostic test results will improve Outcome: Progressing   

## 2021-08-26 NOTE — Progress Notes (Signed)
Progress Note   Patient: Antonio White. OPF:[id number] DOB: 1932/05/02 DOA: 08/25/2021     0 DOS: the patient was seen and examined on 08/26/2021   Brief hospital course: 88 year old male PMH including diabetes mellitus on insulin presented with hyperglycemia and syncope.  Admitted for hypoglycemia and AKI.  Discharged in July on alogliptin, Lantus 35 units nightly.  Glipizide was discontinued.  He reports he has been taking 60 units of Lantus in addition to alogliptin and glipizide.  After careful review, hypoglycemia seems easily explained by excessive insulin and oral antihyperglycemics.  Monitor overnight, if stable blood sugar, anticipate discharge tomorrow.  Assessment and Plan: Hypoglycemia in the setting of insulin-dependent type 2 diabetes.  Hemoglobin A1c 7.2. --Secondary to excessive insulin as well as 2 oral hypoglycemic agents.  Associated with syncope and several falls at home most likely. -- Blood sugars rebounding nicely but still under 200.  Appreciate diabetic coordinator.  Discontinue all oral agents on discharge.  Suggest low-dose insulin on discharge.  Will monitor for now.   Syncope -- Secondary to hypoglycemia.  CT head and neck negative.  TSH within normal limits.  Echocardiogram reassuring with normal LVEF, grade 1 diastolic dysfunction.  No further evaluation suggested.  EKG showed sinus bradycardia.  Troponin was minimally elevated, trivial, clinically insignificant.   AKI on CKD stage IIIb --Improving, continue IV fluids.  Prerenal pattern.  Hold lisinopril.  CK was within normal limits.  Check BMP in AM.   Chronic constipation -- Laxatives as needed.   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  Constitutional:      General: He is not in acute distress.    Appearance: He is not ill-appearing or toxic-appearing.  Cardiovascular:     Rate and Rhythm: Normal rate and regular rhythm.     Heart sounds: Murmur (RUSB 2/6) heard.     Comments: Telemetry SR Pulmonary:     Effort: Pulmonary effort is normal. No respiratory distress.     Breath sounds: No wheezing, rhonchi or rales.  Musculoskeletal:     Right lower leg: No edema.     Left lower leg: No edema.  Neurological:     Mental Status: He is alert.  Psychiatric:        Mood and Affect: Mood normal.        Behavior: Behavior normal.     Data Reviewed:  CBG stable Creatinine down to 2.56, BUN down to 45 TSH WNL HgbA1c 7.2 CXR NAD CT head and neck no acute abnormalities Echo normal LVEF, grade 1 diastolic dysfunction Valves poor visualized but no notable abnormalities EKG SB  Family Communication: none   Disposition: Status is: Observation   Planned Discharge Destination: Home    Time spent:  35 minutes  Author: Brendia Sacks, MD 08/26/2021 6:50 PM  For on call review [email or web address].

## 2021-08-26 NOTE — Progress Notes (Signed)
Mobility Specialist - Progress Note    08/26/21 1213  Mobility  HOB Elevated/Bed Position Self regulated  Activity Ambulated with assistance in hallway  Range of Motion/Exercises Active  Level of Assistance Modified independent, requires aide device or extra time  Assistive Device Front wheel walker  Distance Ambulated (ft) 250 ft  Activity Response Tolerated well  Transport method Ambulatory  $Mobility charge 1 Mobility   Pt received in bed and agreeable to mobility.  Pt to bed after session with all needs met.     [facility]

## 2021-08-27 DIAGNOSIS — N179 Acute kidney failure, unspecified: Secondary | ICD-10-CM | POA: Diagnosis not present

## 2021-08-27 DIAGNOSIS — E162 Hypoglycemia, unspecified: Secondary | ICD-10-CM | POA: Diagnosis not present

## 2021-08-27 LAB — GLUCOSE, CAPILLARY
Glucose-Capillary: 110 mg/dL — ABNORMAL HIGH (ref 70–99)
Glucose-Capillary: 192 mg/dL — ABNORMAL HIGH (ref 70–99)

## 2021-08-27 LAB — BASIC METABOLIC PANEL
Anion gap: 5 (ref 5–15)
BUN: 32 mg/dL — ABNORMAL HIGH (ref 8–23)
CO2: 23 mmol/L (ref 22–32)
Calcium: 8 mg/dL — ABNORMAL LOW (ref 8.9–10.3)
Chloride: 114 mmol/L — ABNORMAL HIGH (ref 98–111)
Creatinine, Ser: 1.74 mg/dL — ABNORMAL HIGH (ref 0.61–1.24)
GFR, Estimated: 37 mL/min — ABNORMAL LOW (ref >60)
Glucose, Bld: 127 mg/dL — ABNORMAL HIGH (ref 70–99)
Potassium: 4 mmol/L (ref 3.5–5.1)
Sodium: 142 mmol/L (ref 135–145)

## 2021-08-27 MED ORDER — INSULIN GLARGINE-YFGN 100 UNIT/ML ~~LOC~~ SOLN: 20.0000 [IU] | Freq: Every day | SUBCUTANEOUS | 11 refills | Status: AC

## 2021-08-27 MED ORDER — AMLODIPINE BESYLATE 10 MG PO TABS: 10.0000 mg | ORAL_TABLET | Freq: Every morning | ORAL | Status: AC

## 2021-08-27 NOTE — Discharge Summary (Signed)
.     Physician Discharge Summary  Antonio White. ZOX:[id number] DOB: 22-Feb-1932 DOA: 08/25/2021  PCP: Anson Fret, MD  Admit date: 08/25/2021 Discharge date: 08/27/2021  Admitted From: home Discharge disposition: home   Recommendations for Outpatient Follow-Up:   Home health BMP.CBC 1 week  Discharge Diagnosis:   Principal Problem:   Hypoglycemia Active Problems:   Hypertension, essential   CKD stage G3b/A1, GFR 30-44 and albumin creatinine ratio <30 mg/g (HCC)   T2DM (type 2 diabetes mellitus) (HCC)   AKI (acute kidney injury) (HCC)   Syncope   First degree AV block   Chronic constipation   Heel ulcer (HCC)    Discharge Condition: Improved.  Diet recommendation: Carbohydrate-modified.    Wound care: None.  Code status: Full.   History of Present Illness:   Antonio Murad. is a 88 y.o. male with medical history significant of insulin-dependent type 2 diabetes, hypertension, obesity (BMI 31.52), CKD stage IIIb, GERD presents to the ED via EMS for evaluation of hypoglycemia and syncope.  Patient lives by himself and when his home health nurse visited him today she found him unresponsive on the floor.  Upon EMS arrival, CBG 44 and patient was given 25 g D10, repeat CBG 114.  He was AAOx4 upon arrival to the ED.  Labs showing no leukocytosis, hemoglobin 11.1 (stable), bicarb 21, glucose 113, BUN 52, creatinine 3.1 (baseline 1.4-1.7), A1c pending, UA pending.  Chest x-ray showing no active disease.  CT head/C-spine negative for acute finding.  EKG showing sinus rhythm with first-degree AV block and new T wave inversions in lateral leads. Troponin not checked.  He was given food, juice, and 1 L normal saline bolus.  TRH called to admit for hypoglycemia and AKI.   Patient states the last thing he remembers was sitting and watching TV and then remembers waking up with EMS around him.  He was feeling well when he was watching television and was not having any  dizziness/lightheadedness, chest pain, or shortness of breath.  He is currently taking 3 different medications for diabetes but does not remember the names.  He takes 60 units of long-acting insulin every evening and 2 oral medications.  Patient states his PCP is at the [place].  States normally his blood glucose is high at night in the 200s and in the morning it drops to the 70s.  He is concerned that the dose of his insulin evening long-acting might be too high.  He reports good p.o. intake and thinks he is drinking fluids adequately.  Reports chronic constipation for which he takes laxatives and tends to have a bowel movement every day.  Denies nausea, vomiting, or abdominal pain.  Denies fevers or any other complaints.   Hospital Course by Problem:   Hypoglycemia in the setting of insulin-dependent type 2 diabetes.  Hemoglobin A1c 7.2. --Secondary to excessive insulin as well as 2 oral hypoglycemic agents.  Associated with syncope and several falls at home most likely. -- Blood sugars rebounding nicely but still under 200.  Appreciate diabetic coordinator.  Discontinue all oral agents on discharge.  Suggest low-dose insulin on discharge.   Syncope -- Secondary to hypoglycemia.  CT head and neck negative.  TSH within normal limits.  Echocardiogram reassuring with normal LVEF, grade 1 diastolic dysfunction.  No further evaluation suggested.  EKG showed sinus bradycardia.  Troponin was minimally elevated, trivial, clinically insignificant.   AKI on CKD stage IIIb --Improving, continue IV fluids.  Prerenal pattern.   -  improved -outpatient follow up   Chronic constipation -- Laxatives as needed.   Right heel ulcer -Wound care    Medical Consultants:      Discharge Exam:   Vitals:   08/27/21 0418 08/27/21 1036  BP: (!) 171/66 (!) 149/57  Pulse: 63 62  Resp: 17   Temp: 98.5 F (36.9 C)   SpO2: 98%    Vitals:   08/26/21 1459 08/26/21 2103 08/27/21 0418 08/27/21 1036  BP: (!) 165/67  (!) 162/64 (!) 171/66 (!) 149/57  Pulse: 74 69 63 62  Resp: 20 18 17    Temp: 98.7 F (37.1 C) 98 F (36.7 C) 98.5 F (36.9 C)   TempSrc: Oral Oral Oral   SpO2: 99% 97% 98%   Weight:      Height:        General exam: Appears calm and comfortable.    The results of significant diagnostics from this hospitalization (including imaging, microbiology, ancillary and laboratory) are listed below for reference.     Procedures and Diagnostic Studies:   ECHOCARDIOGRAM COMPLETE  Result Date: 08/26/2021    ECHOCARDIOGRAM REPORT   Patient Name:   Antonio Macgregor. Date of Exam: 08/26/2021 Medical Rec #:  08/28/2021          Height:       71.0 in Accession #:    [id number]         Weight:       226.0 lb Date of Birth:  [birth year]          BSA:          2.221 m Patient Age:    89 years           BP:           162/66 mmHg Patient Gender: M                  HR:           57 bpm. Exam Location:  Inpatient Procedure: 2D Echo, Cardiac Doppler, Color Doppler and Intracardiac            Opacification Agent Indications:    Syncope R55                 Abnormal ECG R94.31  History:        Patient has no prior history of Echocardiogram examinations.                 Arrythmias:First-degree AV block; Risk Factors:Diabetes and                 Hypertension. Chronic kidney disease. Hypoglycemia.  Sonographer:    05/29/1932 RDCS Referring Phys: Leta Jungling VASUNDHRA RATHORE IMPRESSIONS  1. LV is not well visualized. Left ventricular ejection fraction, by estimation, is 60 to 65%. The left ventricle has normal function. The left ventricle has no regional wall motion abnormalities. Left ventricular diastolic parameters are consistent with Grade I diastolic dysfunction (impaired relaxation).  2. Right ventricular systolic function is normal. The right ventricular size is normal. Tricuspid regurgitation signal is inadequate for assessing PA pressure.  3. The mitral valve was not well visualized. No evidence of mitral valve  regurgitation.  4. The aortic valve was not well visualized. Aortic valve regurgitation is moderate.  5. The inferior vena cava is normal in size with greater than 50% respiratory variability, suggesting right atrial pressure of 3 mmHg. Comparison(s): No prior Echocardiogram. FINDINGS  Left Ventricle: LV is not well visualized.  Left ventricular ejection fraction, by estimation, is 60 to 65%. The left ventricle has normal function. The left ventricle has no regional wall motion abnormalities. Definity contrast agent was given IV to delineate the left ventricular endocardial borders. The left ventricular internal cavity size was normal in size. Left ventricular diastolic parameters are consistent with Grade I diastolic dysfunction (impaired relaxation). Right Ventricle: The right ventricular size is normal. Right ventricular systolic function is normal. Tricuspid regurgitation signal is inadequate for assessing PA pressure. Left Atrium: Left atrial size was normal in size. Right Atrium: Right atrial size was normal in size. Pericardium: The pericardium was not well visualized. Mitral Valve: The mitral valve was not well visualized. No evidence of mitral valve regurgitation. Tricuspid Valve: The tricuspid valve is not well visualized. Tricuspid valve regurgitation is not demonstrated. Aortic Valve: The aortic valve was not well visualized. Aortic valve regurgitation is moderate. Aortic valve mean gradient measures 21.0 mmHg. Aortic valve peak gradient measures 35.9 mmHg. Aortic valve area, by VTI measures 1.36 cm. Pulmonic Valve: Pulmonic valve regurgitation is not visualized. Aorta: The aortic root is normal in size and structure. Venous: The inferior vena cava is normal in size with greater than 50% respiratory variability, suggesting right atrial pressure of 3 mmHg.  LEFT VENTRICLE PLAX 2D LVIDd:         3.90 cm   Diastology LVIDs:         2.00 cm   LV e' medial:    5.63 cm/s LV PW:         1.50 cm   LV E/e'  medial:  17.3 LV IVS:        1.60 cm   LV e' lateral:   5.36 cm/s LVOT diam:     2.30 cm   LV E/e' lateral: 18.2 LV SV:         94 LV SV Index:   42 LVOT Area:     4.15 cm  LEFT ATRIUM             Index LA diam:        3.70 cm 1.67 cm/m LA Vol (A2C):   56.9 ml 25.62 ml/m LA Vol (A4C):   45.0 ml 20.26 ml/m LA Biplane Vol: 54.2 ml 24.40 ml/m  AORTIC VALVE AV Area (Vmax):    1.55 cm AV Area (Vmean):   1.69 cm AV Area (VTI):     1.36 cm AV Vmax:           299.50 cm/s AV Vmean:          215.500 cm/s AV VTI:            0.696 m AV Peak Grad:      35.9 mmHg AV Mean Grad:      21.0 mmHg LVOT Vmax:         112.00 cm/s LVOT Vmean:        87.700 cm/s LVOT VTI:          0.227 m LVOT/AV VTI ratio: 0.33  AORTA Ao Root diam: 3.20 cm MITRAL VALVE MV Area (PHT): 3.28 cm     SHUNTS MV Decel Time: 231 msec     Systemic VTI:  0.23 m MV E velocity: 97.30 cm/s   Systemic Diam: 2.30 cm MV A velocity: 106.00 cm/s MV E/A ratio:  0.92 Carolan Clines Electronically signed by Carolan Clines Signature Date/Time: 08/26/2021/12:07:28 PM    Final    CT Head Wo Contrast  Result Date: 08/25/2021 CLINICAL DATA:  Head trauma, moderate-severe; Neck trauma (Age >= 65y). Hypoglycemia EXAM: CT HEAD WITHOUT CONTRAST CT CERVICAL SPINE WITHOUT CONTRAST TECHNIQUE: Multidetector CT imaging of the head and cervical spine was performed following the standard protocol without intravenous contrast. Multiplanar CT image reconstructions of the cervical spine were also generated. RADIATION DOSE REDUCTION: This exam was performed according to the departmental dose-optimization program which includes automated exposure control, adjustment of the mA and/or kV according to patient size and/or use of iterative reconstruction technique. COMPARISON:  CT head 08/23/2021 FINDINGS: CT HEAD FINDINGS Brain: Normal anatomic configuration. Parenchymal volume loss is commensurate with the patient's age. Mild stable periventricular white matter changes are present likely  reflecting the sequela of small vessel ischemia. No abnormal intra or extra-axial mass lesion or fluid collection. No abnormal mass effect or midline shift. No evidence of acute intracranial hemorrhage or infarct. Ventricular size is normal. Cerebellum unremarkable. Vascular: No asymmetric hyperdense vasculature at the skull base. Skull: Intact Sinuses/Orbits: Paranasal sinuses are clear. Orbits are unremarkable. Other: Mastoid air cells and middle ear cavities are clear. CT CERVICAL SPINE FINDINGS Alignment: Normal. Skull base and vertebrae: Craniocervical alignment is normal. The atlantodental interval is not widened. No acute fracture of the cervical spine. Vertebral body height is preserved. Soft tissues and spinal canal: No prevertebral fluid or swelling. No visible canal hematoma. Disc levels: Intervertebral disc heights are preserved. Prevertebral soft tissues are not thickened on sagittal reformats. Spinal canal is widely patent. Uncovertebral arthrosis results in moderate to severe right neuroforaminal narrowing at C3-4 and moderate bilateral neuroforaminal narrowing at C4-5. Remaining neural foramina are widely patent. Upper chest: Negative. Other: None IMPRESSION: 1. No acute intracranial abnormality. No calvarial fracture. 2. No acute fracture or listhesis of the cervical spine. Electronically Signed   By: Helyn NumbersAshesh  Parikh M.D.   On: 08/25/2021 19:14   CT Cervical Spine Wo Contrast  Result Date: 08/25/2021 CLINICAL DATA:  Head trauma, moderate-severe; Neck trauma (Age >= 65y). Hypoglycemia EXAM: CT HEAD WITHOUT CONTRAST CT CERVICAL SPINE WITHOUT CONTRAST TECHNIQUE: Multidetector CT imaging of the head and cervical spine was performed following the standard protocol without intravenous contrast. Multiplanar CT image reconstructions of the cervical spine were also generated. RADIATION DOSE REDUCTION: This exam was performed according to the departmental dose-optimization program which includes automated  exposure control, adjustment of the mA and/or kV according to patient size and/or use of iterative reconstruction technique. COMPARISON:  CT head 08/23/2021 FINDINGS: CT HEAD FINDINGS Brain: Normal anatomic configuration. Parenchymal volume loss is commensurate with the patient's age. Mild stable periventricular white matter changes are present likely reflecting the sequela of small vessel ischemia. No abnormal intra or extra-axial mass lesion or fluid collection. No abnormal mass effect or midline shift. No evidence of acute intracranial hemorrhage or infarct. Ventricular size is normal. Cerebellum unremarkable. Vascular: No asymmetric hyperdense vasculature at the skull base. Skull: Intact Sinuses/Orbits: Paranasal sinuses are clear. Orbits are unremarkable. Other: Mastoid air cells and middle ear cavities are clear. CT CERVICAL SPINE FINDINGS Alignment: Normal. Skull base and vertebrae: Craniocervical alignment is normal. The atlantodental interval is not widened. No acute fracture of the cervical spine. Vertebral body height is preserved. Soft tissues and spinal canal: No prevertebral fluid or swelling. No visible canal hematoma. Disc levels: Intervertebral disc heights are preserved. Prevertebral soft tissues are not thickened on sagittal reformats. Spinal canal is widely patent. Uncovertebral arthrosis results in moderate to severe right neuroforaminal narrowing at C3-4 and moderate bilateral neuroforaminal narrowing at C4-5. Remaining neural foramina are widely patent.  Upper chest: Negative. Other: None IMPRESSION: 1. No acute intracranial abnormality. No calvarial fracture. 2. No acute fracture or listhesis of the cervical spine. Electronically Signed   By: Helyn Numbers M.D.   On: 08/25/2021 19:14   DG Chest Portable 1 View  Result Date: 08/25/2021 CLINICAL DATA:  Fall EXAM: PORTABLE CHEST 1 VIEW COMPARISON:  07/19/2021 FINDINGS: The heart size and mediastinal contours are within normal limits. Aortic  atherosclerosis. Both lungs are clear. The visualized skeletal structures are unremarkable. IMPRESSION: No active disease. Electronically Signed   By: Jasmine Pang M.D.   On: 08/25/2021 18:33     Labs:   Basic Metabolic Panel: Recent Labs  Lab 08/23/21 1251 08/25/21 1801 08/26/21 0506 08/27/21 0757  NA 141 140 145 142  K 4.1 4.4 3.6 4.0  CL 106 109 119* 114*  CO2 26 21* 22 23  GLUCOSE 77 113* 111* 127*  BUN 36* 52* 45* 32*  CREATININE 1.72* 3.17* 2.56* 1.74*  CALCIUM 9.0 8.6* 8.2* 8.0*   GFR Estimated Creatinine Clearance: 36.5 mL/min (A) (by C-G formula based on SCr of 1.74 mg/dL (H)). Liver Function Tests: No results for input(s): "AST", "ALT", "ALKPHOS", "BILITOT", "PROT", "ALBUMIN" in the last 168 hours. No results for input(s): "LIPASE", "AMYLASE" in the last 168 hours. No results for input(s): "AMMONIA" in the last 168 hours. Coagulation profile No results for input(s): "INR", "PROTIME" in the last 168 hours.  CBC: Recent Labs  Lab 08/23/21 1251 08/23/21 1410 08/25/21 1801  WBC TEST REQUEST RECEIVED WITHOUT APPROPRIATE SPECIMEN NOTIFIED KAREN IN ED 11.2* 8.3  NEUTROABS PENDING 8.9* 5.7  HGB TEST REQUEST RECEIVED WITHOUT APPROPRIATE SPECIMEN NOTIFIED KAREN IN ED 11.1* 11.1*  HCT TEST REQUEST RECEIVED WITHOUT APPROPRIATE SPECIMEN NOTIFIED KAREN IN ED 33.8* 34.3*  MCV TEST REQUEST RECEIVED WITHOUT APPROPRIATE SPECIMEN NOTIFIED KAREN IN ED 88.9 90.3  PLT TEST REQUEST RECEIVED WITHOUT APPROPRIATE SPECIMEN NOTIFIED KAREN IN ED 168 175   Cardiac Enzymes: Recent Labs  Lab 08/25/21 2209  CKTOTAL 350   BNP: Invalid input(s): "POCBNP" CBG: Recent Labs  Lab 08/26/21 1212 08/26/21 1454 08/26/21 1619 08/26/21 2106 08/27/21 0753  GLUCAP 136* 174* 156* 204* 110*   D-Dimer No results for input(s): "DDIMER" in the last 72 hours. Hgb A1c Recent Labs    08/25/21 1801  HGBA1C 7.2*   Lipid Profile No results for input(s): "CHOL", "HDL", "LDLCALC", "TRIG",  "CHOLHDL", "LDLDIRECT" in the last 72 hours. Thyroid function studies Recent Labs    08/25/21 2209  TSH 0.728   Anemia work up No results for input(s): "VITAMINB12", "FOLATE", "FERRITIN", "TIBC", "IRON", "RETICCTPCT" in the last 72 hours. Microbiology No results found for this or any previous visit (from the past 240 hour(s)).   Discharge Instructions:   Discharge Instructions     Diet Carb Modified   Complete by: As directed    Discharge instructions   Complete by: As directed    Check blood sugar closely-- document and bring to PCP   Increase activity slowly   Complete by: As directed    No wound care   Complete by: As directed       Allergies as of 08/27/2021       Reactions   Metformin And Related Hives   Penicillins Hives   Did it involve swelling of the face/tongue/throat, SOB, or low BP? No Did it involve sudden or severe rash/hives, skin peeling, or any reaction on the inside of your mouth or nose? Yes Did you need to seek medical  attention at a hospital or doctor's office? No When did it last happen?  Over 51 Years Ago     If all above answers are "NO", may proceed with cephalosporin use.   Pneumococcal Vaccine Swelling, Other (See Comments)   Swelling of upper limb - reported by [facility]        Medication List     STOP taking these medications    Alogliptin Benzoate 25 MG Tabs   feeding supplement Liqd   glipiZIDE 10 MG tablet Commonly known as: GLUCOTROL       TAKE these medications    amLODipine 10 MG tablet Commonly known as: NORVASC Take 1 tablet (10 mg total) by mouth every morning.   aspirin EC 81 MG tablet Take 81 mg by mouth every morning. Swallow whole.   atenolol 100 MG tablet Commonly known as: TENORMIN Take 100 mg by mouth every morning.   atorvastatin 20 MG tablet Commonly known as: LIPITOR Take 20 mg by mouth at bedtime.   Centrum Silver 50+Men Tabs Take 1 tablet by mouth every morning.   famotidine 20 MG  tablet Commonly known as: PEPCID Take 20 mg by mouth at bedtime.   insulin glargine-yfgn 100 UNIT/ML injection Commonly known as: SEMGLEE Inject 0.2 mLs (20 Units total) into the skin at bedtime. What changed: how much to take   latanoprost 0.005 % ophthalmic solution Commonly known as: XALATAN Place 1 drop into both eyes at bedtime.   lisinopril 20 MG tablet Commonly known as: ZESTRIL Take 20 mg by mouth every morning.   timolol 0.5 % ophthalmic gel-forming Commonly known as: TIMOPTIC-XR Place 1 drop into the right eye every morning.          Time coordinating discharge:  Signed:  Joseph Art DO  [facility] 08/27/2021, 11:06 AM

## 2021-08-27 NOTE — TOC Transition Note (Signed)
Transition of Care [facility]) - CM/SW Discharge Note   Patient Details  Name: Antonio White. MRN: [id number] Date of Birth: 07/18/1932  [facility] CM/SW Contact:  Lanier Clam, RN Phone Number: 08/27/2021, 12:29 PM   Clinical Narrative:  d/c home w/Active w/HHPT/OT/social worker.No further CM needs.     Final next level of care: Home w Home Health Services Barriers to Discharge: No Barriers Identified   Patient Goals and CMS Choice   CMS Medicare.gov Compare Post Acute Care list provided to:: Patient Represenative (must comment) ([name]) Choice offered to / list presented to : Sibling  Discharge Placement                       Discharge Plan and Services   Discharge Planning Services: CM Consult                      HH Arranged: PT, OT, Social Work [facility] Agency: [facility] Date HH Agency Contacted: 08/27/21 Time HH Agency Contacted: 1229 Representative spoke with at [facility] Agency: Victorino Dike  Social Determinants of Health (SDOH) Interventions     Readmission Risk Interventions     No data to display

## 2021-08-27 NOTE — Discharge Instructions (Signed)

## 2021-08-27 NOTE — Evaluation (Signed)
Physical Therapy Evaluation Patient Details Name: Antonio White. MRN: [id number] DOB: 1932-09-25 Today's Date: 08/27/2021  History of Present Illness  88 year old male PMH including diabetes mellitus on insulin presented with hyperglycemia and syncope.  Admitted for hypoglycemia and AKI. Recent admission 7/15-7/19/23 with AKI.  Clinical Impression  Pt is mobilizing well at a modified independent level. He ambulated 350' with a RW, no loss of balance. He is ready to DC home from a PT standpoint. No further PT indicated, will sign off.        Recommendations for follow up therapy are one component of a multi-disciplinary discharge planning process, led by the attending physician.  Recommendations may be updated based on patient status, additional functional criteria and insurance authorization.  Follow Up Recommendations No PT follow up      Assistance Recommended at Discharge    Patient can return home with the following       Equipment Recommendations None recommended by PT  Recommendations for Other Services       Functional Status Assessment Patient has not had a recent decline in their functional status     Precautions / Restrictions Precautions Precautions: Fall Precaution Comments: fell on day of admission, pt reports this was due to hypoglycemia (CBG 40) Restrictions Weight Bearing Restrictions: No      Mobility  Bed Mobility Overal bed mobility: Modified Independent             General bed mobility comments: used bedrail    Transfers Overall transfer level: Modified independent Equipment used: Rolling walker (2 wheels)                    Ambulation/Gait Ambulation/Gait assistance: Modified independent (Device/Increase time) Gait Distance (Feet): 350 Feet Assistive device: Rolling walker (2 wheels) Gait Pattern/deviations: WFL(Within Functional Limits) Gait velocity: WFL     General Gait Details: steady, no loss of balance, no  pain  Stairs            Wheelchair Mobility    Modified [name] (Stroke Patients Only)       Balance Overall balance assessment: Modified Independent                                           Pertinent Vitals/Pain Pain Assessment Pain Assessment: No/denies pain    Home Living Family/patient expects to be discharged to:: Private residence Living Arrangements: Alone Available Help at Discharge: Family;Available PRN/intermittently Type of Home: House Home Access: Ramped entrance       Home Layout: One level Home Equipment: Agricultural consultant (2 wheels);Cane - single point;BSC/3in1      Prior Function Prior Level of Function : Independent/Modified Independent             Mobility Comments: using walker 2* recently had shingles with RLE pain ADLs Comments: mod ind with bathing, dressing. still drives     Hand Dominance        Extremity/Trunk Assessment   Upper Extremity Assessment Upper Extremity Assessment: Overall WFL for tasks assessed    Lower Extremity Assessment Lower Extremity Assessment: Overall WFL for tasks assessed    Cervical / Trunk Assessment Cervical / Trunk Assessment: Normal  Communication   Communication: No difficulties  Cognition Arousal/Alertness: Awake/alert Behavior During Therapy: WFL for tasks assessed/performed Overall Cognitive Status: Within Functional Limits for tasks assessed  General Comments      Exercises     Assessment/Plan    PT Assessment Patient does not need any further PT services  PT Problem List         PT Treatment Interventions      PT Goals (Current goals can be found in the Care Plan section)  Acute Rehab PT Goals Patient Stated Goal: likes to play golf daily PT Goal Formulation: All assessment and education complete, DC therapy    Frequency       Co-evaluation               AM-PAC PT "6 Clicks" Mobility   Outcome Measure Help needed turning from your back to your side while in a flat bed without using bedrails?: None Help needed moving from lying on your back to sitting on the side of a flat bed without using bedrails?: None Help needed moving to and from a bed to a chair (including a wheelchair)?: None Help needed standing up from a chair using your arms (e.g., wheelchair or bedside chair)?: None Help needed to walk in hospital room?: None Help needed climbing 3-5 steps with a railing? : None 6 Click Score: 24    End of Session Equipment Utilized During Treatment: Gait belt Activity Tolerance: Patient tolerated treatment well Patient left: in chair;with call bell/phone within reach;with nursing/sitter in room Nurse Communication: Mobility status      Time: 6283-6629 PT Time Calculation (min) (ACUTE ONLY): 19 min   Charges:   PT Evaluation $PT Eval Low Complexity: 1 Low         Tamala Ser PT 08/27/2021  Acute Rehabilitation Services  Office [phone]

## 2021-08-27 NOTE — Progress Notes (Signed)
Nutrition Brief Note  Consult received for diet education. Patient with hx of DM and was admitted due to hypoglycemia.   Wt Readings from Last 15 Encounters:  08/25/21 102.5 kg  08/23/21 121.1 kg  07/19/21 100.2 kg    Body mass index is 31.52 kg/m. Patient meets criteria for obesity based on current BMI. Skin WDL.  Current diet order is Regular and patient ate 100% of breakfast yesterday (855 kcal and 32 grams protein).  Patient is Observation status. Discharge order entered earlier today.  Entered Carbohydrate Counting for People with Diabetes handout to AVS. Patient was seen by DM Coordinator yesterday afternoon.  Labs received; HgbA1c: Medications reviewed.  No additional nutrition interventions warranted at this time. If nutrition issues arise, please consult RD.       Trenton Gammon, MS, RD, LDN, CNSC Registered Dietitian II Inpatient Clinical Nutrition RD pager # and on-call/weekend pager # available in [facility]

## 2021-08-27 NOTE — Progress Notes (Signed)
Mobility Specialist - Progress Note   08/27/21 1222  Mobility  HOB Elevated/Bed Position Self regulated  Activity Ambulated with assistance in hallway  Range of Motion/Exercises Active  Level of Assistance Modified independent, requires aide device or extra time  Assistive Device Front wheel walker  Distance Ambulated (ft) 250 ft  Activity Response Tolerated well  Transport method Ambulatory  $Mobility charge 1 Mobility   Pt received in recliner and agreeable to mobility. Pt to recliner after session with all needs met.    [facility]

## 2023-07-06 DEATH — deceased
